# Patient Record
Sex: Male | Born: 1960 | Race: White | Hispanic: No | Marital: Single | Smoking: Former smoker
Health system: Southern US, Community
[De-identification: ages and names within clinical notes are randomized; demographics above are authoritative.]

## PROBLEM LIST (undated history)

## (undated) DIAGNOSIS — K5792 Diverticulitis of intestine, part unspecified, without perforation or abscess without bleeding: Secondary | ICD-10-CM

## (undated) DIAGNOSIS — R17 Unspecified jaundice: Secondary | ICD-10-CM

## (undated) HISTORY — PX: APPENDECTOMY: SHX54

---

## 2018-01-22 ENCOUNTER — Inpatient Hospital Stay (HOSPITAL_COMMUNITY)
Admission: EM | Admit: 2018-01-22 | Discharge: 2018-01-24 | DRG: 392 | Disposition: A | Discharge status: Home / Self Care | Payer: Medicaid - Out of State | Attending: Internal Medicine | Admitting: Internal Medicine

## 2018-01-22 ENCOUNTER — Encounter (HOSPITAL_COMMUNITY): Payer: Self-pay | Admitting: *Deleted

## 2018-01-22 ENCOUNTER — Emergency Department (HOSPITAL_COMMUNITY): Payer: Medicaid - Out of State

## 2018-01-22 ENCOUNTER — Other Ambulatory Visit: Payer: Self-pay

## 2018-01-22 DIAGNOSIS — K5720 Diverticulitis of large intestine with perforation and abscess without bleeding: Principal | ICD-10-CM | POA: Diagnosis present

## 2018-01-22 DIAGNOSIS — K5792 Diverticulitis of intestine, part unspecified, without perforation or abscess without bleeding: Secondary | ICD-10-CM | POA: Diagnosis not present

## 2018-01-22 DIAGNOSIS — Z8249 Family history of ischemic heart disease and other diseases of the circulatory system: Secondary | ICD-10-CM | POA: Diagnosis not present

## 2018-01-22 DIAGNOSIS — Z87891 Personal history of nicotine dependence: Secondary | ICD-10-CM

## 2018-01-22 DIAGNOSIS — R17 Unspecified jaundice: Secondary | ICD-10-CM | POA: Diagnosis not present

## 2018-01-22 DIAGNOSIS — Z825 Family history of asthma and other chronic lower respiratory diseases: Secondary | ICD-10-CM | POA: Diagnosis not present

## 2018-01-22 DIAGNOSIS — E663 Overweight: Secondary | ICD-10-CM | POA: Diagnosis present

## 2018-01-22 DIAGNOSIS — R109 Unspecified abdominal pain: Secondary | ICD-10-CM | POA: Diagnosis present

## 2018-01-22 DIAGNOSIS — Z6826 Body mass index (BMI) 26.0-26.9, adult: Secondary | ICD-10-CM | POA: Diagnosis not present

## 2018-01-22 LAB — LIPASE, BLOOD: LIPASE: 22 U/L (ref 11–51)

## 2018-01-22 LAB — CBC
HEMATOCRIT: 43.9 % (ref 39.0–52.0)
HEMOGLOBIN: 14 g/dL (ref 13.0–17.0)
MCH: 30.2 pg (ref 26.0–34.0)
MCHC: 31.9 g/dL (ref 30.0–36.0)
MCV: 94.8 fL (ref 78.0–100.0)
Platelets: 251 10*3/uL (ref 150–400)
RBC: 4.63 MIL/uL (ref 4.22–5.81)
RDW: 13.3 % (ref 11.5–15.5)
WBC: 12.6 10*3/uL — AB (ref 4.0–10.5)

## 2018-01-22 LAB — COMPREHENSIVE METABOLIC PANEL
ALT: 19 U/L (ref 17–63)
ANION GAP: 13 (ref 5–15)
AST: 17 U/L (ref 15–41)
Albumin: 4.3 g/dL (ref 3.5–5.0)
Alkaline Phosphatase: 66 U/L (ref 38–126)
BILIRUBIN TOTAL: 1.8 mg/dL — AB (ref 0.3–1.2)
BUN: 15 mg/dL (ref 6–20)
CHLORIDE: 101 mmol/L (ref 101–111)
CO2: 25 mmol/L (ref 22–32)
Calcium: 9.8 mg/dL (ref 8.9–10.3)
Creatinine, Ser: 1.07 mg/dL (ref 0.61–1.24)
Glucose, Bld: 96 mg/dL (ref 65–99)
POTASSIUM: 4 mmol/L (ref 3.5–5.1)
Sodium: 139 mmol/L (ref 135–145)
Total Protein: 7.7 g/dL (ref 6.5–8.1)

## 2018-01-22 LAB — URINALYSIS, ROUTINE W REFLEX MICROSCOPIC
Bilirubin Urine: NEGATIVE
GLUCOSE, UA: NEGATIVE mg/dL
Hgb urine dipstick: NEGATIVE
Ketones, ur: 20 mg/dL — AB
LEUKOCYTES UA: NEGATIVE
NITRITE: NEGATIVE
PH: 6 (ref 5.0–8.0)
PROTEIN: NEGATIVE mg/dL
Specific Gravity, Urine: 1.016 (ref 1.005–1.030)

## 2018-01-22 MED ORDER — CIPROFLOXACIN IN D5W 400 MG/200ML IV SOLN
400.0000 mg | Freq: Once | INTRAVENOUS | Status: AC
  Administered 2018-01-22: 400 mg via INTRAVENOUS
  Filled 2018-01-22: qty 200

## 2018-01-22 MED ORDER — KETOROLAC TROMETHAMINE 30 MG/ML IJ SOLN
30.0000 mg | Freq: Once | INTRAMUSCULAR | Status: AC
  Administered 2018-01-22: 30 mg via INTRAVENOUS
  Filled 2018-01-22: qty 1

## 2018-01-22 MED ORDER — ONDANSETRON HCL 4 MG/2ML IJ SOLN
4.0000 mg | Freq: Four times a day (QID) | INTRAMUSCULAR | Status: DC | PRN
  Filled 2018-01-22: qty 2

## 2018-01-22 MED ORDER — IOPAMIDOL (ISOVUE-300) INJECTION 61%
100.0000 mL | Freq: Once | INTRAVENOUS | Status: AC | PRN
  Administered 2018-01-22: 100 mL via INTRAVENOUS

## 2018-01-22 MED ORDER — METRONIDAZOLE IN NACL 5-0.79 MG/ML-% IV SOLN
500.0000 mg | Freq: Once | INTRAVENOUS | Status: AC
  Administered 2018-01-22: 500 mg via INTRAVENOUS
  Filled 2018-01-22: qty 100

## 2018-01-22 MED ORDER — SODIUM CHLORIDE 0.9 % IV BOLUS
500.0000 mL | Freq: Once | INTRAVENOUS | Status: AC
  Administered 2018-01-22: 500 mL via INTRAVENOUS

## 2018-01-22 MED ORDER — HYDROMORPHONE HCL 1 MG/ML IJ SOLN
1.0000 mg | INTRAMUSCULAR | Status: DC | PRN
  Administered 2018-01-23: 1 mg via INTRAVENOUS
  Filled 2018-01-22: qty 1

## 2018-01-22 NOTE — ED Provider Notes (Signed)
[facility] EMERGENCY DEPARTMENT Provider Note   CSN: [id number] Arrival date & time: 01/22/18  1833     History   Chief Complaint Chief Complaint  Patient presents with  . Abdominal Pain    HPI Brandon ServeRandy Zavala is a 57 y.o. male.  HPI Patient presents with abdominal pain.  Began yesterday.  Feels like it may be in the upper abdomen but his whole abdomen hurts.  No nausea or vomiting.  States he has had chills and shakes.  No dysuria.  States he took some Advil and to help with the pain and then the pain localized to the left lower quadrant while he was taking the Advil.  Pain is dull.  Not changed with eating but has had a decreased appetite. History reviewed. No pertinent past medical history.  There are no active problems to display for this patient.   Past Surgical History:  Procedure Laterality Date  . APPENDECTOMY          Home Medications    Prior to Admission medications   Not on File    Family History No family history on file.  Social History Social History   Tobacco Use  . Smoking status: Former Games developermoker  . Smokeless tobacco: Never Used  Substance Use Topics  . Alcohol use: Yes    Frequency: Never  . Drug use: Never     Allergies   Patient has no known allergies.   Review of Systems Review of Systems  Constitutional: Positive for appetite change.  HENT: Negative for congestion.   Respiratory: Negative for shortness of breath.   Gastrointestinal: Positive for abdominal pain. Negative for diarrhea, nausea and vomiting.  Genitourinary: Negative for difficulty urinating.  Musculoskeletal: Negative for back pain.  Neurological: Negative for syncope.  Hematological: Negative for adenopathy.  Psychiatric/Behavioral: Negative for confusion.     Physical Exam Updated Vital Signs BP (!) 142/99   Pulse 96   Temp 99.1 F (37.3 C) (Oral)   Resp 16   Ht 6' (1.829 m)   Wt 88.5 kg (195 lb)   SpO2 97%   BMI 26.45 kg/m   Physical Exam    Constitutional: He appears well-developed.  HENT:  Head: Normocephalic.  Cardiovascular: Normal rate.  Pulmonary/Chest: Breath sounds normal.  Abdominal: Normal appearance.  Moderate diffuse tenderness.  No hernia palpated.  Worse tenderness left lower quadrant.  Neurological: He is alert.  Skin: Skin is warm. Capillary refill takes less than 2 seconds.     ED Treatments / Results  Labs (all labs ordered are listed, but only abnormal results are displayed) Labs Reviewed  COMPREHENSIVE METABOLIC PANEL - Abnormal; Notable for the following components:      Result Value   Total Bilirubin 1.8 (*)    All other components within normal limits  CBC - Abnormal; Notable for the following components:   WBC 12.6 (*)    All other components within normal limits  URINALYSIS, ROUTINE W REFLEX MICROSCOPIC - Abnormal; Notable for the following components:   Ketones, ur 20 (*)    All other components within normal limits  LIPASE, BLOOD    EKG None  Radiology Ct Abdomen Pelvis W Contrast  Result Date: 01/22/2018 CLINICAL DATA:  Mid central abdominal pain starting yesterday. Diverticulitis suspected EXAM: CT ABDOMEN AND PELVIS WITH CONTRAST TECHNIQUE: Multidetector CT imaging of the abdomen and pelvis was performed using the standard protocol following bolus administration of intravenous contrast. CONTRAST:  100mL ISOVUE-300 IOPAMIDOL (ISOVUE-300) INJECTION 61% COMPARISON:  None. FINDINGS:  Lower chest: Normal heart size without pericardial effusion. Clear lung bases. Hepatobiliary: No focal liver abnormality is seen. No gallstones, gallbladder wall thickening, or biliary dilatation. Pancreas: Unremarkable. No pancreatic ductal dilatation or surrounding inflammatory changes. Spleen: Normal in size without focal abnormality. Adrenals/Urinary Tract: Adrenal glands are unremarkable. Kidneys are normal, without renal calculi, focal lesion, or hydronephrosis. Bladder is unremarkable. Stomach/Bowel:  Decompressed stomach with normal small bowel rotation. Mild sympathetic ileus of jejunal loops adjacent to pericolonic inflammation in the left lower quadrant and pelvis secondary to acute diverticulitis. Tiny contained extraluminal gas is seen within the mesenteric fat ventral to the proximal sigmoid. No abscess. 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 No lymphadenopathy. Reproductive: Top-normal size prostate. Unremarkable seminal vesicles. Other: No free fluid. Musculoskeletal: Multilevel degenerative disc disease from L2 through S1. No acute osseous abnormality. No pars defects or listhesis. IMPRESSION: Acute sigmoid diverticulitis with moderate degree of pericolonic inflammation and contained tiny foci of extraluminal gas from presumed perforated diverticulum, series 2/71. No abscess or bowel obstruction. Sympathetic distention and ileus of adjacent jejunal loops from the pericolonic inflammation. These results were called by telephone at the time of interpretation on 01/22/2018 at 9:58 pm to Dr. Benjiman Core , who verbally acknowledged these results. Electronically Signed   By: Tollie Eth M.D.   On: 01/22/2018 21:58    Procedures Procedures (including critical care time)  Medications Ordered in ED Medications  sodium chloride 0.9 % bolus 500 mL (has no administration in time range)  ciprofloxacin (CIPRO) IVPB 400 mg (has no administration in time range)  metroNIDAZOLE (FLAGYL) IVPB 500 mg (has no administration in time range)  iopamidol (ISOVUE-300) 61 % injection 100 mL (100 mLs Intravenous Contrast Given 01/22/18 2141)     Initial Impression / Assessment and Plan / ED Course  I have reviewed the triage vital signs and the nursing notes.  Pertinent labs & imaging results that were available during my care of the patient were reviewed by me and considered in my medical decision making (see chart for details).     Patient with abdominal pain.  White  count mildly elevated.  CT scan shows perforated diverticulitis.  No abscess or however with the perforation will admit to hospital for IV antibiotics.  Will admit to hospitalist.  Need for a drain.  Final Clinical Impressions(s) / ED Diagnoses   Final diagnoses:  Diverticulitis of large intestine with perforation without abscess or bleeding    ED Discharge Orders    None       Benjiman Core, MD 01/22/18 2204

## 2018-01-22 NOTE — ED Notes (Signed)
Patient transported to CT 

## 2018-01-22 NOTE — H&P (Signed)
History and Physical    Brandon ServeRandy Zavala ZOX:[id number] DOB: 11/05/1960 DOA: 01/22/2018  PCP: Patient, No Pcp Per   Patient coming from: Home.  I have personally briefly reviewed patient's [facility] medical [facility]  Chief Complaint: Abdominal pain.  HPI: Brandon ServeRandy Zavala is a 57 y.o. male with no significant previous past medical history who is coming to the emergency department with complaints of abdominal pain that started yesterday associated with anorexia, chills and rigors, but denies fever, nausea, emesis, melena or hematochezia.  Denies dysuria, frequency or hematuria.  Denies sore throat, rhinorrhea, dyspnea, wheezing, hemoptysis, chest pain, palpitations, dizziness, diaphoresis, PND, orthopnea or pitting edema of the lower extremities.  No polyuria, polydipsia or blurred vision.  No heat or cold intolerance.  ED Course: Initial vital signs temperature 99.70F, pulse 105, respirations 16, blood pressure 154/94 mmHg and O2 sat 98% on room air.  The patient received a 500 mL normal saline bolus, ciprofloxacin 400 mg IVPB and Flagyl 500 mg IVPB.  I added Toradol 30 mg, Zofran 4 mg and hydromorphone 1 mg IVP while he was in the ER for symptoms.  His urinalysis was normal except for ketonuria 20 mg/dL.  CBC shows a white count was 12.6, hemoglobin 14.0 g/dL and platelets [id number].  His CMP was normal, except for total bilirubin of 1.8 mg/dL.  No previous results were available to compare to.  Imaging: CT abdomen/pelvis with contrast shows acute sigmoid diverticulitis with moderate degree of pericolonic inflammation and contained tiny foci of extraluminal gas from presumed perforated diverticulum, series 2/71. No abscess or bowel obstruction. Sympathetic distention and ileus of adjacent jejunal loops from the pericolonic inflammation.  Please see images and full radiology report for further detail.  Review of Systems: As per HPI otherwise 10 point review of systems negative.    History  reviewed. No pertinent past medical history.  Past Surgical History:  Procedure Laterality Date  . APPENDECTOMY       reports that he has quit smoking. He has never used smokeless tobacco. He reports that he drinks alcohol. He reports that he does not use drugs.  No Known Allergies  Family History  Problem Relation Age of Onset  . Hypertension Mother   . COPD Sister   . Hypertension Sister     Prior to Admission medications   Medication Sig Start Date End Date Taking? Authorizing Provider  diphenhydrAMINE (BENADRYL) 25 MG tablet Take 25 mg by mouth at bedtime as needed.   Yes [provider]    Physical Exam: Vitals:   01/22/18 1859 01/22/18 2130 01/22/18 2214  BP: (!) 158/94 (!) 142/99 (!) 165/87  Pulse: (!) 105 96 93  Resp: 16  18  Temp: 99.1 F (37.3 C)    TempSrc: Oral    SpO2: 98% 97% 98%  Weight: 88.5 kg (195 lb)    Height: 6' (1.829 m)      Constitutional: NAD, calm, comfortable Eyes: PERRL, lids and conjunctivae normal ENMT: Mucous membranes are moist. Posterior pharynx clear of any exudate or lesions. Neck: normal, supple, no masses, no thyromegaly Respiratory: clear to auscultation bilaterally, no wheezing, no crackles. Normal respiratory effort. No accessory muscle use.  Cardiovascular: Regular rate and rhythm, no murmurs / rubs / gallops. No extremity edema. 2+ pedal pulses. No carotid bruits.  Abdomen: Nondistended.  Soft, positive LLQ tenderness with diffuse radiation, no guarding/rebound/masses palpated. No hepatosplenomegaly. Bowel sounds positive.  Musculoskeletal: no clubbing / cyanosis. Good ROM, no contractures. Normal muscle tone.  Skin: no clinically significant rashes, lesions, ulcers on limited dermatological exam. Neurologic: CN 2-12 grossly intact. Sensation intact, DTR normal. Strength 5/5 in all 4.  Psychiatric: Normal judgment and insight. Alert and oriented x 4. Normal mood.    Labs on Admission: I have personally reviewed  following labs and imaging studies  CBC: Recent Labs  Lab 01/22/18 1911  WBC 12.6*  HGB 14.0  HCT 43.9  MCV 94.8  PLT 251   Basic Metabolic Panel: Recent Labs  Lab 01/22/18 1911  NA 139  K 4.0  CL 101  CO2 25  GLUCOSE 96  BUN 15  CREATININE 1.07  CALCIUM 9.8   GFR: Estimated Creatinine Clearance: 85.6 mL/min (by C-G formula based on SCr of 1.07 mg/dL). Liver Function Tests: Recent Labs  Lab 01/22/18 1911  AST 17  ALT 19  ALKPHOS 66  BILITOT 1.8*  PROT 7.7  ALBUMIN 4.3   Recent Labs  Lab 01/22/18 1911  LIPASE 22   No results for input(s): AMMONIA in the last 168 hours. Coagulation Profile: No results for input(s): INR, PROTIME in the last 168 hours. Cardiac Enzymes: No results for input(s): CKTOTAL, CKMB, CKMBINDEX, TROPONINI in the last 168 hours. BNP (last 3 results) No results for input(s): PROBNP in the last 8760 hours. HbA1C: No results for input(s): HGBA1C in the last 72 hours. CBG: No results for input(s): GLUCAP in the last 168 hours. Lipid Profile: No results for input(s): CHOL, HDL, LDLCALC, TRIG, CHOLHDL, LDLDIRECT in the last 72 hours. Thyroid Function Tests: No results for input(s): TSH, T4TOTAL, FREET4, T3FREE, THYROIDAB in the last 72 hours. Anemia Panel: No results for input(s): VITAMINB12, FOLATE, FERRITIN, TIBC, IRON, RETICCTPCT in the last 72 hours. Urine analysis:    Component Value Date/Time   COLORURINE YELLOW 01/22/2018 1901   APPEARANCEUR CLEAR 01/22/2018 1901   LABSPEC 1.016 01/22/2018 1901   PHURINE 6.0 01/22/2018 1901   GLUCOSEU NEGATIVE 01/22/2018 1901   HGBUR NEGATIVE 01/22/2018 1901   BILIRUBINUR NEGATIVE 01/22/2018 1901   KETONESUR 20 (A) 01/22/2018 1901   PROTEINUR NEGATIVE 01/22/2018 1901   NITRITE NEGATIVE 01/22/2018 1901   LEUKOCYTESUR NEGATIVE 01/22/2018 1901    Radiological Exams on Admission: Ct Abdomen Pelvis W Contrast  Result Date: 01/22/2018 CLINICAL DATA:  Mid central abdominal pain starting  yesterday. Diverticulitis suspected EXAM: CT ABDOMEN AND PELVIS WITH CONTRAST TECHNIQUE: Multidetector CT imaging of the abdomen and pelvis was performed using the standard protocol following bolus administration of intravenous contrast. CONTRAST:  ISOVUE-300 IOPAMIDOL (ISOVUE-300) INJECTION 61% COMPARISON:  None. FINDINGS: Lower chest: Normal heart size without pericardial effusion. Clear lung bases. Hepatobiliary: No focal liver abnormality is seen. No gallstones, gallbladder wall thickening, or biliary dilatation. Pancreas: Unremarkable. No pancreatic ductal dilatation or surrounding inflammatory changes. Spleen: Normal in size without focal abnormality. Adrenals/Urinary Tract: Adrenal glands are unremarkable. Kidneys are normal, without renal calculi, focal lesion, or hydronephrosis. Bladder is unremarkable. Stomach/Bowel: Decompressed stomach with normal small bowel rotation. Mild sympathetic ileus of jejunal loops adjacent to pericolonic inflammation in the left lower quadrant and pelvis secondary to acute diverticulitis. Tiny contained extraluminal gas is seen within the mesenteric fat ventral to the proximal sigmoid. No abscess. 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 No lymphadenopathy. Reproductive: Top-normal size prostate. Unremarkable seminal vesicles. Other: No free fluid. Musculoskeletal: Multilevel degenerative disc disease from L2 through S1. No acute osseous abnormality. No pars defects or listhesis. IMPRESSION: Acute sigmoid diverticulitis with moderate degree of pericolonic inflammation and contained tiny foci of extraluminal gas from  presumed perforated diverticulum, series 2/71. No abscess or bowel obstruction. Sympathetic distention and ileus of adjacent jejunal loops from the pericolonic inflammation. These results were called by telephone at the time of interpretation on 01/22/2018 at 9:58 pm to Dr. Benjiman Core , who verbally acknowledged these  results. Electronically Signed   By: Tollie Eth M.D.   On: 01/22/2018 21:58    EKG: Independently reviewed.  Assessment/Plan Principal problems:   Acute diverticulitis Admit to telemetry/inpatient. Keep n.p.o. Continue IV fluids. Continue ciprofloxacin 400 mg IVPB every 12 hours. Continue Flagyl 500 mg IVPB every 8 hours. Zofran as needed for nausea/emesis. Hydromorphone 1 mg IVP every 4 hours as needed for pain. Protonix 40 mg IVP every 24 hours. Consult general surgery in the morning.  Active problems:   Hyperbilirubinemia Normal transaminases. No findings on CT abdomen to explain this finding. Follow-up CMP daily to trend bilirubin level. Consider right upper quadrant ultrasound.   DVT prophylaxis: SCDs. Code Status: Full code. Family Communication:  Disposition Plan: Admit for IV antibiotics, symptoms management and general surgery evaluation in the morning. Consults called: Routine general surgery counseling a.m. Admission status: Inpatient/telemetry.   Bobette Mo MD [facility] Pager [phone].  If 7PM-7AM, please contact night-coverage [email or web address] Password [facility]  01/22/2018, 10:45 PM   This document was prepared using Dragon voice recognition software and may contain some unintended transcription errors.

## 2018-01-22 NOTE — ED Triage Notes (Signed)
Pt c/o mid center abd pain that started yesterday, denies any n//v/d, pain is worse with palpation of abd area.

## 2018-01-23 ENCOUNTER — Other Ambulatory Visit: Payer: Self-pay

## 2018-01-23 ENCOUNTER — Encounter (HOSPITAL_COMMUNITY): Payer: Self-pay | Admitting: *Deleted

## 2018-01-23 DIAGNOSIS — K5720 Diverticulitis of large intestine with perforation and abscess without bleeding: Principal | ICD-10-CM

## 2018-01-23 DIAGNOSIS — E663 Overweight: Secondary | ICD-10-CM

## 2018-01-23 DIAGNOSIS — R17 Unspecified jaundice: Secondary | ICD-10-CM

## 2018-01-23 DIAGNOSIS — K5792 Diverticulitis of intestine, part unspecified, without perforation or abscess without bleeding: Secondary | ICD-10-CM

## 2018-01-23 LAB — CBC WITH DIFFERENTIAL/PLATELET
BASOS PCT: 0 %
Basophils Absolute: 0 10*3/uL (ref 0.0–0.1)
EOS ABS: 0.1 10*3/uL (ref 0.0–0.7)
Eosinophils Relative: 1 %
HEMATOCRIT: 41 % (ref 39.0–52.0)
Hemoglobin: 12.9 g/dL — ABNORMAL LOW (ref 13.0–17.0)
Lymphocytes Relative: 10 %
Lymphs Abs: 1.1 10*3/uL (ref 0.7–4.0)
MCH: 29.9 pg (ref 26.0–34.0)
MCHC: 31.5 g/dL (ref 30.0–36.0)
MCV: 95.1 fL (ref 78.0–100.0)
MONO ABS: 0.5 10*3/uL (ref 0.1–1.0)
MONOS PCT: 5 %
Neutro Abs: 8.8 10*3/uL — ABNORMAL HIGH (ref 1.7–7.7)
Neutrophils Relative %: 84 %
Platelets: 228 10*3/uL (ref 150–400)
RBC: 4.31 MIL/uL (ref 4.22–5.81)
RDW: 13.5 % (ref 11.5–15.5)
WBC: 10.5 10*3/uL (ref 4.0–10.5)

## 2018-01-23 LAB — BASIC METABOLIC PANEL
Anion gap: 13 (ref 5–15)
BUN: 13 mg/dL (ref 6–20)
CALCIUM: 8.6 mg/dL — AB (ref 8.9–10.3)
CO2: 22 mmol/L (ref 22–32)
CREATININE: 0.98 mg/dL (ref 0.61–1.24)
Chloride: 104 mmol/L (ref 101–111)
GFR calc non Af Amer: >60 mL/min (ref >60)
Glucose, Bld: 86 mg/dL (ref 65–99)
Potassium: 3.4 mmol/L — ABNORMAL LOW (ref 3.5–5.1)
SODIUM: 139 mmol/L (ref 135–145)

## 2018-01-23 MED ORDER — HYDROMORPHONE HCL 1 MG/ML IJ SOLN
1.0000 mg | INTRAMUSCULAR | Status: DC | PRN
  Administered 2018-01-23 (×2): 1 mg via INTRAVENOUS
  Filled 2018-01-23 (×2): qty 1

## 2018-01-23 MED ORDER — DIPHENHYDRAMINE HCL 25 MG PO CAPS
25.0000 mg | ORAL_CAPSULE | Freq: Every evening | ORAL | Status: DC | PRN
  Administered 2018-01-24: 25 mg via ORAL
  Filled 2018-01-23 (×2): qty 1

## 2018-01-23 MED ORDER — ACETAMINOPHEN 650 MG RE SUPP: 650.0000 mg | Freq: Four times a day (QID) | RECTAL | Status: DC | PRN

## 2018-01-23 MED ORDER — SODIUM CHLORIDE 0.9 % IV SOLN
INTRAVENOUS | Status: DC
  Administered 2018-01-23 – 2018-01-24 (×2): via INTRAVENOUS

## 2018-01-23 MED ORDER — ONDANSETRON HCL 4 MG/2ML IJ SOLN
4.0000 mg | Freq: Four times a day (QID) | INTRAMUSCULAR | Status: DC | PRN
  Administered 2018-01-23: 4 mg via INTRAVENOUS

## 2018-01-23 MED ORDER — ONDANSETRON HCL 4 MG PO TABS: 4.0000 mg | ORAL_TABLET | Freq: Four times a day (QID) | ORAL | Status: DC | PRN

## 2018-01-23 MED ORDER — PANTOPRAZOLE SODIUM 40 MG IV SOLR
40.0000 mg | INTRAVENOUS | Status: DC
  Administered 2018-01-23 (×2): 40 mg via INTRAVENOUS
  Filled 2018-01-23 (×2): qty 40

## 2018-01-23 MED ORDER — METRONIDAZOLE IN NACL 5-0.79 MG/ML-% IV SOLN
500.0000 mg | Freq: Three times a day (TID) | INTRAVENOUS | Status: DC
  Administered 2018-01-23 – 2018-01-24 (×5): 500 mg via INTRAVENOUS
  Filled 2018-01-23 (×5): qty 100

## 2018-01-23 MED ORDER — ACETAMINOPHEN 325 MG PO TABS: 650.0000 mg | ORAL_TABLET | Freq: Four times a day (QID) | ORAL | Status: DC | PRN

## 2018-01-23 MED ORDER — CIPROFLOXACIN IN D5W 400 MG/200ML IV SOLN
400.0000 mg | Freq: Two times a day (BID) | INTRAVENOUS | Status: DC
  Administered 2018-01-23 – 2018-01-24 (×3): 400 mg via INTRAVENOUS
  Filled 2018-01-23 (×3): qty 200

## 2018-01-23 MED ORDER — OXYCODONE HCL 5 MG PO TABS
5.0000 mg | ORAL_TABLET | ORAL | Status: DC | PRN
  Administered 2018-01-24 (×2): 5 mg via ORAL
  Filled 2018-01-23 (×2): qty 1

## 2018-01-23 NOTE — Progress Notes (Signed)
PROGRESS NOTE  Brandon Zavala ZOX:[id number] DOB: January 16, 1961 DOA: 01/22/2018 PCP: Patient, No Pcp Per  HPI/Recap of past 22 hours: 57 year old male with no significant past medical history presented to the emergency room on 4/11 with complaints of abdominal pain times 24 hours and workup revealing elevated bilirubin of 1.8 and also acute diverticulitis.  Patient started on IV fluids, IV antibiotics and made n.p.o.  Mildly elevated white blood cell count.  By following day, vital signs stable.  White count normalized.  Seen by general surgery who advance diet to full liquids. Patient himself feeling much better, no complaints .  No abdominal pain tolerating clear liquids  Assessment/Plan: Principal Problem:   Diverticulitis of large intestine with perforation without abscess or bleeding: Mild.  Continue IV antibiotics, full liquids.  Might be able to discharge home tomorrow if tolerating solid food no further episodes of pain.  Patient received education on diverticulitis.  Will need outpatient colonoscopy in a few months once this is stabilized  Elevated bilirubin: At 1.8.  May be in relation to acute illness.  Repeat labs in the morning  Overweight: Patient meets criteria with BMI greater than 25   Code Status: Full code   Family Communication: Left message for family  Disposition Plan: Potential discharge tomorrow if tolerating solids, no pain   Consultants:  General surgery  Procedures:  None  Antimicrobials:  IV Cipro and Flagyl 4/11-present  DVT prophylaxis: SCDs   Objective: Vitals:   01/22/18 2319 01/23/18 0016 01/23/18 0017 01/23/18 0618  BP: (!) 163/102  (!) 152/89 (!) 165/93  Pulse: 95  94 (!) 105  Resp: 18  18 18   Temp:   98.6 F (37 C) 98.6 F (37 C)  TempSrc:   Oral Oral  SpO2: 98%  98% 98%  Weight:  89.5 kg (197 lb 5 oz)    Height:  6' (1.829 m)      Intake/Output Summary (Last 24 hours) at 01/23/2018 1248 Last data filed at 01/23/2018 1017 Gross  per 24 hour  Intake 1886.67 ml  Output 300 ml  Net 1586.67 ml   Filed Weights   01/22/18 1859 01/23/18 0016  Weight: 88.5 kg (195 lb) 89.5 kg (197 lb 5 oz)   Body mass index is 26.76 kg/m.  Exam:   General: Alert and oriented x3, no acute distress  HEENT: Normocephalic and atraumatic mucous membranes are slightly dry  Cardiovascular: Regular rate and rhythm, S1-S2  Respiratory: Clear to auscultation bilaterally  Abdomen: Soft, nontender, nondistended, positive bowel sounds  Musculoskeletal: No clubbing cyanosis or edema  Skin: No skin breaks, tears or lesions  Psychiatry: Appropriate, no evidence of psychoses   Data Reviewed: CBC: Recent Labs  Lab 01/22/18 1911 01/23/18 0622  WBC 12.6* 10.5  NEUTROABS  --  8.8*  HGB 14.0 12.9*  HCT 43.9 41.0  MCV 94.8 95.1  PLT 251 228   Basic Metabolic Panel: Recent Labs  Lab 01/22/18 1911 01/23/18 0622  NA 139 139  K 4.0 3.4*  CL 101 104  CO2 25 22  GLUCOSE 96 86  BUN 15 13  CREATININE 1.07 0.98  CALCIUM 9.8 8.6*   GFR: Estimated Creatinine Clearance: 93.5 mL/min (by C-G formula based on SCr of 0.98 mg/dL). Liver Function Tests: Recent Labs  Lab 01/22/18 1911  AST 17  ALT 19  ALKPHOS 66  BILITOT 1.8*  PROT 7.7  ALBUMIN 4.3   Recent Labs  Lab 01/22/18 1911  LIPASE 22   No results for input(s): AMMONIA  in the last 168 hours. Coagulation Profile: No results for input(s): INR, PROTIME in the last 168 hours. Cardiac Enzymes: No results for input(s): CKTOTAL, CKMB, CKMBINDEX, TROPONINI in the last 168 hours. BNP (last 3 results) No results for input(s): PROBNP in the last 8760 hours. HbA1C: No results for input(s): HGBA1C in the last 72 hours. CBG: No results for input(s): GLUCAP in the last 168 hours. Lipid Profile: No results for input(s): CHOL, HDL, LDLCALC, TRIG, CHOLHDL, LDLDIRECT in the last 72 hours. Thyroid Function Tests: No results for input(s): TSH, T4TOTAL, FREET4, T3FREE, THYROIDAB in  the last 72 hours. Anemia Panel: No results for input(s): VITAMINB12, FOLATE, FERRITIN, TIBC, IRON, RETICCTPCT in the last 72 hours. Urine analysis:    Component Value Date/Time   COLORURINE YELLOW 01/22/2018 1901   APPEARANCEUR CLEAR 01/22/2018 1901   LABSPEC 1.016 01/22/2018 1901   PHURINE 6.0 01/22/2018 1901   GLUCOSEU NEGATIVE 01/22/2018 1901   HGBUR NEGATIVE 01/22/2018 1901   BILIRUBINUR NEGATIVE 01/22/2018 1901   KETONESUR 20 (A) 01/22/2018 1901   PROTEINUR NEGATIVE 01/22/2018 1901   NITRITE NEGATIVE 01/22/2018 1901   LEUKOCYTESUR NEGATIVE 01/22/2018 1901   Sepsis Labs: @LABRCNTIP (procalcitonin:4,lacticidven:4)  )No results found for this or any previous visit (from the past 240 hour(s)).    Studies: Ct Abdomen Pelvis W Contrast  Result Date: 01/22/2018 CLINICAL DATA:  Mid central abdominal pain starting yesterday. Diverticulitis suspected EXAM: CT ABDOMEN AND PELVIS WITH CONTRAST TECHNIQUE: Multidetector CT imaging of the abdomen and pelvis was performed using the standard protocol following bolus administration of intravenous contrast. CONTRAST:  100mL ISOVUE-300 IOPAMIDOL (ISOVUE-300) INJECTION 61% COMPARISON:  None. FINDINGS: Lower chest: Normal heart size without pericardial effusion. Clear lung bases. Hepatobiliary: No focal liver abnormality is seen. No gallstones, gallbladder wall thickening, or biliary dilatation. Pancreas: Unremarkable. No pancreatic ductal dilatation or surrounding inflammatory changes. Spleen: Normal in size without focal abnormality. Adrenals/Urinary Tract: Adrenal glands are unremarkable. Kidneys are normal, without renal calculi, focal lesion, or hydronephrosis. Bladder is unremarkable. Stomach/Bowel: Decompressed stomach with normal small bowel rotation. Mild sympathetic ileus of jejunal loops adjacent to pericolonic inflammation in the left lower quadrant and pelvis secondary to acute diverticulitis. Tiny contained extraluminal gas is seen within the  mesenteric fat ventral to the proximal sigmoid. No abscess. 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 No lymphadenopathy. Reproductive: Top-normal size prostate. Unremarkable seminal vesicles. Other: No free fluid. Musculoskeletal: Multilevel degenerative disc disease from L2 through S1. No acute osseous abnormality. No pars defects or listhesis. IMPRESSION: Acute sigmoid diverticulitis with moderate degree of pericolonic inflammation and contained tiny foci of extraluminal gas from presumed perforated diverticulum, series 2/71. No abscess or bowel obstruction. Sympathetic distention and ileus of adjacent jejunal loops from the pericolonic inflammation. These results were called by telephone at the time of interpretation on 01/22/2018 at 9:58 pm to Dr. Benjiman CoreNATHAN PICKERING , who verbally acknowledged these results. Electronically Signed   By: Tollie Ethavid  Kwon M.D.   On: 01/22/2018 21:58    Scheduled Meds: . pantoprazole (PROTONIX) IV  40 mg Intravenous Q24H    Continuous Infusions: . sodium chloride 100 mL/hr at 01/23/18 0025  . ciprofloxacin Stopped (01/23/18 1017)  . metronidazole Stopped (01/23/18 [id number])     LOS: 1 day     Hollice EspySendil K Ashaad Gaertner, MD [facility]  To reach me or the doctor on call, go to: [email or web address] Password [email or web address]  01/23/2018, 12:48 PM

## 2018-01-23 NOTE — Consult Note (Signed)
[facility] Consult  Reason for Consult: Diverticulitis  Referring Physician:  Dr. Maryland Pink   Chief Complaint    Abdominal Pain      Brandon Zavala is a 57 y.o. male.  HPI: Brandon Zavala is a 57 yo who is otherwise healthy and active who was brought in to the hospital with diverticulitis. Brandon Zavala reports having some LLQ pain and chills on Wednesday, and that the pain continued to progress to even generalized and intense pain requiring a visit to the ED.  Brandon Zavala underwent a CT scan, and this demonstrated acute sigmoid diverticulitis with a small amount of extraluminal air around the colon.  Brandon Zavala otherwise denied any fevers, nausea/vomiting, and has been having some loose stools. Brandon Zavala has never had a colonoscopy, and has not had any unintentional weight loss or blood per rectum.  History reviewed. No pertinent past medical history.  Past Surgical History:  Procedure Laterality Date  . APPENDECTOMY     No colon cancer Family History  Problem Relation Age of Onset  . Hypertension Mother   . COPD Sister   . Hypertension Sister     Social History   Tobacco Use  . Smoking status: Former Research scientist (life sciences)  . Smokeless tobacco: Never Used  Substance Use Topics  . Alcohol use: Yes    Frequency: Never  . Drug use: Never    Medications:  I have reviewed the patient's current medications. Prior to Admission:  Medications Prior to Admission  Medication Sig Dispense Refill Last Dose  . diphenhydrAMINE (BENADRYL) 25 MG tablet Take 25 mg by mouth at bedtime as needed.   Past Month at Unknown time   Scheduled: . pantoprazole (PROTONIX) IV  40 mg Intravenous Q24H   Continuous: . sodium chloride 100 mL/hr at 01/23/18 0025  . ciprofloxacin Stopped (01/23/18 1017)  . metronidazole Stopped (01/23/18 5956)   LOV:FIEPPIRJJOACZ **OR** acetaminophen, diphenhydrAMINE, HYDROmorphone (DILAUDID) injection, ondansetron **OR** ondansetron (ZOFRAN) IV, ondansetron (ZOFRAN) IV, oxyCODONE  Allergies: No Known  Allergies   ROS:  A comprehensive review of systems was negative except for: Gastrointestinal: positive for abdominal pain and loose stool  Blood pressure (!) 165/93, pulse (!) 105, temperature 98.6 F (37 C), temperature source Oral, resp. rate 18, height 6' (1.829 m), weight 197 lb 5 oz (89.5 kg), SpO2 98 %. Physical Exam  Constitutional: Brandon Zavala is oriented to person, place, and time. Brandon Zavala appears well-developed and well-nourished.  HENT:  Head: Normocephalic.  Eyes: Pupils are equal, round, and reactive to light.  Cardiovascular: Normal rate and regular rhythm.  Pulmonary/Chest: Effort normal.  Abdominal: Soft. Normal appearance. There is tenderness in the left lower quadrant.  LLQ tender with deep palpation, no rebound or guarding  Neurological: Brandon Zavala is alert and oriented to person, place, and time.  Skin: Skin is warm and dry.  Psychiatric: Brandon Zavala has a normal mood and affect. His behavior is normal.    Results: Results for orders placed or performed during the hospital encounter of 01/22/18 (from the past 48 hour(s))  Urinalysis, Routine w reflex microscopic     Status: Abnormal   Collection Time: 01/22/18  7:01 PM  Result Value Ref Range   Color, Urine YELLOW YELLOW   APPearance CLEAR CLEAR   Specific Gravity, Urine 1.016 1.005 - 1.030   pH 6.0 5.0 - 8.0   Glucose, UA NEGATIVE NEGATIVE mg/dL   Hgb urine dipstick NEGATIVE NEGATIVE   Bilirubin Urine NEGATIVE NEGATIVE   Ketones, ur 20 (A) NEGATIVE mg/dL   Protein, ur NEGATIVE NEGATIVE mg/dL  Nitrite NEGATIVE NEGATIVE   Leukocytes, UA NEGATIVE NEGATIVE    Comment: Performed at [facility], [address], [place], [place] [address]  Lipase, blood     Status: None   Collection Time: 01/22/18  7:11 PM  Result Value Ref Range   Lipase 22 11 - 51 U/L    Comment: Performed at [facility], [address] [place], [place] [address]  Comprehensive metabolic panel     Status: Abnormal   Collection Time: 01/22/18  7:11 PM  Result Value  Ref Range   Sodium 139 135 - 145 mmol/L   Potassium 4.0 3.5 - 5.1 mmol/L   Chloride 101 101 - 111 mmol/L   CO2 25 22 - 32 mmol/L   Glucose, Bld 96 65 - 99 mg/dL   BUN 15 6 - 20 mg/dL   Creatinine, Ser 1.07 0.61 - 1.24 mg/dL   Calcium 9.8 8.9 - 10.3 mg/dL   Total Protein 7.7 6.5 - 8.1 g/dL   Albumin 4.3 3.5 - 5.0 g/dL   AST 17 15 - 41 U/L   ALT 19 17 - 63 U/L   Alkaline Phosphatase 66 38 - 126 U/L   Total Bilirubin 1.8 (H) 0.3 - 1.2 mg/dL   GFR calc non Af Amer >60 >60 mL/min   GFR calc Af Amer >60 >60 mL/min    Comment: (NOTE) The eGFR has been calculated using the CKD EPI equation. This calculation has not been validated in all clinical situations. eGFR's persistently <60 mL/min signify possible Chronic Kidney Disease.    Anion gap 13 5 - 15    Comment: Performed at [facility], [address], [place], [place] [address]  CBC     Status: Abnormal   Collection Time: 01/22/18  7:11 PM  Result Value Ref Range   WBC 12.6 (H) 4.0 - 10.5 K/uL   RBC 4.63 4.22 - 5.81 MIL/uL   Hemoglobin 14.0 13.0 - 17.0 g/dL   HCT 43.9 39.0 - 52.0 %   MCV 94.8 78.0 - 100.0 fL   MCH 30.2 26.0 - 34.0 pg   MCHC 31.9 30.0 - 36.0 g/dL   RDW 13.3 11.5 - 15.5 %   Platelets 251 150 - 400 K/uL    Comment: Performed at [facility], [address], [place], [place] [address]  CBC WITH DIFFERENTIAL     Status: Abnormal   Collection Time: 01/23/18  6:22 AM  Result Value Ref Range   WBC 10.5 4.0 - 10.5 K/uL   RBC 4.31 4.22 - 5.81 MIL/uL   Hemoglobin 12.9 (L) 13.0 - 17.0 g/dL   HCT 41.0 39.0 - 52.0 %   MCV 95.1 78.0 - 100.0 fL   MCH 29.9 26.0 - 34.0 pg   MCHC 31.5 30.0 - 36.0 g/dL   RDW 13.5 11.5 - 15.5 %   Platelets 228 150 - 400 K/uL   Neutrophils Relative % 84 %   Neutro Abs 8.8 (H) 1.7 - 7.7 K/uL   Lymphocytes Relative 10 %   Lymphs Abs 1.1 0.7 - 4.0 K/uL   Monocytes Relative 5 %   Monocytes Absolute 0.5 0.1 - 1.0 K/uL   Eosinophils Relative 1 %   Eosinophils Absolute 0.1 0.0 - 0.7 K/uL    Basophils Relative 0 %   Basophils Absolute 0.0 0.0 - 0.1 K/uL    Comment: Performed at [facility], [address] [place], [place] [address]  Basic metabolic panel     Status: Abnormal   Collection Time:  01/23/18  6:22 AM  Result Value Ref Range   Sodium 139 135 - 145 mmol/L   Potassium 3.4 (L) 3.5 - 5.1 mmol/L   Chloride 104 101 - 111 mmol/L   CO2 22 22 - 32 mmol/L   Glucose, Bld 86 65 - 99 mg/dL   BUN 13 6 - 20 mg/dL   Creatinine, Ser 0.98 0.61 - 1.24 mg/dL   Calcium 8.6 (L) 8.9 - 10.3 mg/dL   GFR calc non Af Amer >60 >60 mL/min   GFR calc Af Amer >60 >60 mL/min    Comment: (NOTE) The eGFR has been calculated using the CKD EPI equation. This calculation has not been validated in all clinical situations. eGFR's persistently <60 mL/min signify possible Chronic Kidney Disease.    Anion gap 13 5 - 15    Comment: Performed at [facility], [address], [place], [place] [address]   Personally reviewed- colon with diverticula scattered, sigmoid colon  Thickened with hazy mesentery, small foci of air in the mesentery just adjacent to the colon, no fluid collection  Ct Abdomen Pelvis W Contrast  Result Date: 01/22/2018 CLINICAL DATA:  Mid central abdominal pain starting yesterday. Diverticulitis suspected EXAM: CT ABDOMEN AND PELVIS WITH CONTRAST TECHNIQUE: Multidetector CT imaging of the abdomen and pelvis was performed using the standard protocol following bolus administration of intravenous contrast. CONTRAST:  193m ISOVUE-300 IOPAMIDOL (ISOVUE-300) INJECTION 61% COMPARISON:  None. FINDINGS: Lower chest: Normal heart size without pericardial effusion. Clear lung bases. Hepatobiliary: No focal liver abnormality is seen. No gallstones, gallbladder wall thickening, or biliary dilatation. Pancreas: Unremarkable. No pancreatic ductal dilatation or surrounding inflammatory changes. Spleen: Normal in size without focal abnormality. Adrenals/Urinary Tract: Adrenal glands are unremarkable.  Kidneys are normal, without renal calculi, focal lesion, or hydronephrosis. Bladder is unremarkable. Stomach/Bowel: Decompressed stomach with normal small bowel rotation. Mild sympathetic ileus of jejunal loops adjacent to pericolonic inflammation in the left lower quadrant and pelvis secondary to acute diverticulitis. Tiny contained extraluminal gas is seen within the mesenteric fat ventral to the proximal sigmoid. No abscess. 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 No lymphadenopathy. Reproductive: Top-normal size prostate. Unremarkable seminal vesicles. Other: No free fluid. Musculoskeletal: Multilevel degenerative disc disease from L2 through S1. No acute osseous abnormality. No pars defects or listhesis. IMPRESSION: Acute sigmoid diverticulitis with moderate degree of pericolonic inflammation and contained tiny foci of extraluminal gas from presumed perforated diverticulum, series 2/71. No abscess or bowel obstruction. Sympathetic distention and ileus of adjacent jejunal loops from the pericolonic inflammation. These results were called by telephone at the time of interpretation on 01/22/2018 at 9:58 pm to Dr. NDavonna Belling, who verbally acknowledged these results. Electronically Signed   By: DAshley RoyaltyM.D.   On: 01/22/2018 21:58     Assessment & Plan:  RCurties Conigliarois a 57y.o. male with acute diverticulitis with a small foci of air, which technically makes this complicated diverticulitis but at most [place] I.  Brandon Zavala is doing well overall and his pain is much improved since admission. His labs have also improved.   -PRN for pain, added roxicodone PRN -Full liquid diet and adv as tolerated -IV antibiotics for now -Will see how pain is tomorrow and labs, may be able to go home as early as Saturday /Sunday if doing well with diet and tolerating orals  -Will need colonoscopy in the next 8 weeks -Will follow up to discuss options for elective sigmoid colectomy given  the small perforation   All questions were  answered to the satisfaction of the patient.    Virl Cagey 01/23/2018, 10:27 AM      ]

## 2018-01-24 LAB — COMPREHENSIVE METABOLIC PANEL
ALBUMIN: 3.2 g/dL — AB (ref 3.5–5.0)
ALT: 14 U/L — ABNORMAL LOW (ref 17–63)
ANION GAP: 10 (ref 5–15)
AST: 13 U/L — ABNORMAL LOW (ref 15–41)
Alkaline Phosphatase: 56 U/L (ref 38–126)
BUN: 9 mg/dL (ref 6–20)
CHLORIDE: 103 mmol/L (ref 101–111)
CO2: 25 mmol/L (ref 22–32)
Calcium: 8.5 mg/dL — ABNORMAL LOW (ref 8.9–10.3)
Creatinine, Ser: 0.86 mg/dL (ref 0.61–1.24)
GFR calc non Af Amer: >60 mL/min (ref >60)
GLUCOSE: 100 mg/dL — AB (ref 65–99)
POTASSIUM: 3.5 mmol/L (ref 3.5–5.1)
SODIUM: 138 mmol/L (ref 135–145)
Total Bilirubin: 1.2 mg/dL (ref 0.3–1.2)
Total Protein: 6.4 g/dL — ABNORMAL LOW (ref 6.5–8.1)

## 2018-01-24 LAB — CBC WITH DIFFERENTIAL/PLATELET
BASOS PCT: 0 %
Basophils Absolute: 0 10*3/uL (ref 0.0–0.1)
EOS ABS: 0.1 10*3/uL (ref 0.0–0.7)
EOS PCT: 2 %
HCT: 39.2 % (ref 39.0–52.0)
Hemoglobin: 12 g/dL — ABNORMAL LOW (ref 13.0–17.0)
Lymphocytes Relative: 16 %
Lymphs Abs: 1.4 10*3/uL (ref 0.7–4.0)
MCH: 29.6 pg (ref 26.0–34.0)
MCHC: 30.6 g/dL (ref 30.0–36.0)
MCV: 96.6 fL (ref 78.0–100.0)
MONO ABS: 0.6 10*3/uL (ref 0.1–1.0)
MONOS PCT: 7 %
NEUTROS PCT: 75 %
Neutro Abs: 6.9 10*3/uL (ref 1.7–7.7)
PLATELETS: 246 10*3/uL (ref 150–400)
RBC: 4.06 MIL/uL — ABNORMAL LOW (ref 4.22–5.81)
RDW: 13.5 % (ref 11.5–15.5)
WBC: 9.1 10*3/uL (ref 4.0–10.5)

## 2018-01-24 LAB — HIV ANTIBODY (ROUTINE TESTING W REFLEX): HIV Screen 4th Generation wRfx: NONREACTIVE

## 2018-01-24 MED ORDER — METRONIDAZOLE 500 MG PO TABS: 500.0000 mg | ORAL_TABLET | Freq: Three times a day (TID) | ORAL | 0 refills | Status: DC

## 2018-01-24 MED ORDER — LEVOFLOXACIN 750 MG PO TABS: 750.0000 mg | ORAL_TABLET | Freq: Every day | ORAL | 0 refills | Status: DC

## 2018-01-24 MED ORDER — ACETAMINOPHEN 325 MG PO TABS: 650.0000 mg | ORAL_TABLET | Freq: Four times a day (QID) | ORAL | Status: DC | PRN

## 2018-01-24 MED ORDER — METRONIDAZOLE 500 MG PO TABS: 500.0000 mg | ORAL_TABLET | Freq: Three times a day (TID) | ORAL | 0 refills | Status: AC

## 2018-01-24 MED ORDER — LEVOFLOXACIN 750 MG PO TABS: 750.0000 mg | ORAL_TABLET | Freq: Every day | ORAL | 0 refills | Status: AC

## 2018-01-24 MED ORDER — OXYCODONE HCL 5 MG PO TABS: 5.0000 mg | ORAL_TABLET | ORAL | 0 refills | Status: AC | PRN

## 2018-01-24 NOTE — Discharge Summary (Signed)
Physician Discharge Summary  Brandon Zavala ZOX:[id number] DOB: 01/05/1961 DOA: 01/22/2018  PCP: Patient, No Pcp Per  Admit date: 01/22/2018 Discharge date: 01/24/2018  Time spent: >35 minutes  Recommendations for Outpatient Follow-up:  F/u with surgery for colonoscopy, f/u in 2 weeks to schedule  F/u with PCP in 3-7 days  Discharge Diagnoses:  Principal Problem:   Diverticulitis of large intestine with perforation without abscess or bleeding   Discharge Condition: stable   Diet recommendation: regular   Filed Weights   01/22/18 1859 01/23/18 0016  Weight: 88.5 kg (195 lb) 89.5 kg (197 lb 5 oz)    History of present illness:   57 year old male with no significant past medical history presented to the emergency room on 4/11 with complaints of abdominal pain times 24 hours and workup revealing elevated bilirubin of 1.8 and also acute diverticulitis. Patient started on IV fluids, IV antibiotics and made n.p.o. Mildly elevated white blood cell count.   Hospital Course:    Diverticulitis of large intestine with perforation without abscess or bleeding:Mild. improved with IV antibiotics, diet tolerated well. Then transitioned to oral antibiotic regimen to comopletye the treatment course.  Patient received education on diverticulitis. outpatient colonoscopy in 8 weeks. Short course oxycodone prn for pain   Elevated bilirubin: At 1.8. May be in relation to acute illness. Repeat labs in the morning 1.2  Overweight: Patient meets criteria with BMI greater than 25   Procedures:  none (i.e. Studies not automatically included, echos, thoracentesis, etc; not x-rays)  Consultations:  Surgery   Discharge Exam: Vitals:   01/23/18 2121 01/24/18 0554  BP: (!) 159/94 (!) 165/98  Pulse: 89 (!) 104  Resp: 18 18  Temp: 98.4 F (36.9 C) 98.4 F (36.9 C)  SpO2: 98% 97%    General: no distress  Cardiovascular: s1 s2 rrr Respiratory: CTA BL  Discharge Instructions  Discharge  Instructions    Diet - low sodium heart healthy   Complete by:  As directed    Increase activity slowly   Complete by:  As directed      Allergies as of 01/24/2018   No Known Allergies     Medication List    STOP taking these medications   diphenhydrAMINE 25 MG tablet Commonly known as:  BENADRYL     TAKE these medications   acetaminophen 325 MG tablet Commonly known as:  TYLENOL Take 2 tablets (650 mg total) by mouth every 6 (six) hours as needed for mild pain (or Fever >/= 101).   levofloxacin 750 MG tablet Commonly known as:  LEVAQUIN Take 1 tablet (750 mg total) by mouth daily for 12 days.   metroNIDAZOLE 500 MG tablet Commonly known as:  FLAGYL Take 1 tablet (500 mg total) by mouth 3 (three) times daily for 12 days.   oxyCODONE 5 MG immediate release tablet Commonly known as:  Oxy IR/ROXICODONE Take 1 tablet (5 mg total) by mouth every 4 (four) hours as needed for up to 3 days for moderate pain.      No Known Allergies Follow-up Information    Lucretia RoersBridges, Lindsay C, MD Follow up in 2 week(s).   Specialty:  General Surgery Contact information: [address] [place] [facility] [place] [id number] [phone]            The results of significant diagnostics from this hospitalization (including imaging, microbiology, ancillary and laboratory) are listed below for reference.    Significant Diagnostic Studies: Ct Abdomen Pelvis W Contrast  Result Date: 01/22/2018 CLINICAL DATA:  Mid  central abdominal pain starting yesterday. Diverticulitis suspected EXAM: CT ABDOMEN AND PELVIS WITH CONTRAST TECHNIQUE: Multidetector CT imaging of the abdomen and pelvis was performed using the standard protocol following bolus administration of intravenous contrast. CONTRAST:  ISOVUE-300 IOPAMIDOL (ISOVUE-300) INJECTION 61% COMPARISON:  None. FINDINGS: Lower chest: Normal heart size without pericardial effusion. Clear lung bases. Hepatobiliary: No focal liver abnormality is seen. No  gallstones, gallbladder wall thickening, or biliary dilatation. Pancreas: Unremarkable. No pancreatic ductal dilatation or surrounding inflammatory changes. Spleen: Normal in size without focal abnormality. Adrenals/Urinary Tract: Adrenal glands are unremarkable. Kidneys are normal, without renal calculi, focal lesion, or hydronephrosis. Bladder is unremarkable. Stomach/Bowel: Decompressed stomach with normal small bowel rotation. Mild sympathetic ileus of jejunal loops adjacent to pericolonic inflammation in the left lower quadrant and pelvis secondary to acute diverticulitis. Tiny contained extraluminal gas is seen within the mesenteric fat ventral to the proximal sigmoid. No abscess. 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 No lymphadenopathy. Reproductive: Top-normal size prostate. Unremarkable seminal vesicles. Other: No free fluid. Musculoskeletal: Multilevel degenerative disc disease from L2 through S1. No acute osseous abnormality. No pars defects or listhesis. IMPRESSION: Acute sigmoid diverticulitis with moderate degree of pericolonic inflammation and contained tiny foci of extraluminal gas from presumed perforated diverticulum, series 2/71. No abscess or bowel obstruction. Sympathetic distention and ileus of adjacent jejunal loops from the pericolonic inflammation. These results were called by telephone at the time of interpretation on 01/22/2018 at 9:58 pm to Dr. Benjiman Core , who verbally acknowledged these results. Electronically Signed   By: Tollie Eth M.D.   On: 01/22/2018 21:58    Microbiology: No results found for this or any previous visit (from the past 240 hour(s)).   Labs: Basic Metabolic Panel: Recent Labs  Lab 01/22/18 1911 01/23/18 0622 01/24/18 0622  NA 139 139 138  K 4.0 3.4* 3.5  CL 101 104 103  CO2 25 22 25   GLUCOSE 96 86 100*  BUN 15 13 9   CREATININE 1.07 0.98 0.86  CALCIUM 9.8 8.6* 8.5*   Liver Function Tests: Recent Labs   Lab 01/22/18 1911 01/24/18 0622  AST 17 13*  ALT 19 14*  ALKPHOS 66 56  BILITOT 1.8* 1.2  PROT 7.7 6.4*  ALBUMIN 4.3 3.2*   Recent Labs  Lab 01/22/18 1911  LIPASE 22   No results for input(s): AMMONIA in the last 168 hours. CBC: Recent Labs  Lab 01/22/18 1911 01/23/18 0622 01/24/18 0622  WBC 12.6* 10.5 9.1  NEUTROABS  --  8.8* 6.9  HGB 14.0 12.9* 12.0*  HCT 43.9 41.0 39.2  MCV 94.8 95.1 96.6  PLT 251 228 246   Cardiac Enzymes: No results for input(s): CKTOTAL, CKMB, CKMBINDEX, TROPONINI in the last 168 hours. BNP: BNP (last 3 results) No results for input(s): BNP in the last 8760 hours.  ProBNP (last 3 results) No results for input(s): PROBNP in the last 8760 hours.  CBG: No results for input(s): GLUCAP in the last 168 hours.     SignedEsperanza Sheets  [facility] 01/24/2018, 1:29 PM

## 2018-01-24 NOTE — Discharge Instructions (Signed)

## 2018-01-24 NOTE — Progress Notes (Signed)
[facility] Progress Note     Subjective: Doing well. Ambulating. Tolerating liquids. Did have some nausea with pain meds and vomited, but has tolerated diet since that time. Pain improved.   Objective: Vital signs in last 24 hours: Temp:  [98.1 F (36.7 C)-98.4 F (36.9 C)] 98.4 F (36.9 C) (04/13 0554) Pulse Rate:  [88-104] 104 (04/13 0554) Resp:  [16-18] 18 (04/13 0554) BP: (154-165)/(94-104) 165/98 (04/13 0554) SpO2:  [73 %-98 %] 97 % (04/13 0554) Last BM Date: 01/21/18  Intake/Output from previous day: 04/12 0701 - 04/13 0700 In: 3980 [P.O.:720; I.V.:2460; IV Piggyback:800] Out: 400 [Urine:400] Intake/Output this shift: Total I/O In: 200 [IV Piggyback:200] Out: -   General appearance: alert, cooperative and no distress Resp: normal work breathing GI: soft, nondistended, tender with deep palpation to the LLQ Extremities: extremities normal, atraumatic, no cyanosis or edema  Lab Results:  Recent Labs    01/23/18 0622 01/24/18 0622  WBC 10.5 9.1  HGB 12.9* 12.0*  HCT 41.0 39.2  PLT 228 246   BMET Recent Labs    01/23/18 0622 01/24/18 0622  NA 139 138  K 3.4* 3.5  CL 104 103  CO2 22 25  GLUCOSE 86 100*  BUN 13 9  CREATININE 0.98 0.86  CALCIUM 8.6* 8.5*     Anti-infectives: Anti-infectives (From admission, onward)   Start     Dose/Rate Route Frequency Ordered Stop   01/23/18 1000  ciprofloxacin (CIPRO) IVPB 400 mg     400 mg 200 mL/hr over 60 Minutes Intravenous Every 12 hours 01/23/18 0004     01/23/18 0700  metroNIDAZOLE (FLAGYL) IVPB 500 mg     500 mg 100 mL/hr over 60 Minutes Intravenous Every 8 hours 01/23/18 0004     01/22/18 2200  ciprofloxacin (CIPRO) IVPB 400 mg     400 mg 200 mL/hr over 60 Minutes Intravenous  Once 01/22/18 2159 01/22/18 2318   01/22/18 2200  metroNIDAZOLE (FLAGYL) IVPB 500 mg     500 mg 100 mL/hr over 60 Minutes Intravenous  Once 01/22/18 2159 01/23/18 0015      Assessment/Plan: Mr. Ottis StainHuff is a  57 yo with acute diverticulitis with small contained microperforation. He is doing well. Tolerating diet and pain improved. -If tolerates soft diet for lunch can d/c home -Send home with oral pain meds, roxicodone is what has been helping in the hospital  -Complete a 14 day course total of antibiotics for diverticulitis -Will need outpatient colonoscopy in the next 4-6 weeks -Will see in clinic in about 2 weeks    LOS: 2 days    Lucretia RoersLindsay C Bridges 01/24/2018

## 2018-01-24 NOTE — Progress Notes (Signed)
[facility] PROGRESS NOTE  Blanchie ServeRandy Chipman IEP:[id number] DOB: 08/27/1961 DOA: 01/22/2018 PCP: Patient, No Pcp Per  Brief summary   57 year old male with no significant past medical history presented to the emergency room on 4/11 with complaints of abdominal pain times 24 hours and workup revealing elevated bilirubin of 1.8 and also acute diverticulitis.  Patient started on IV fluids, IV antibiotics and made n.p.o.  Mildly elevated white blood cell count.  Assessment/Plan:  Principal Problem:  Diverticulitis of large intestine with perforation without abscess or bleeding: Mild. improving with IV antibiotics, full liquids. Diet to advance if tolerated today.  Patient received education on diverticulitis.  Will need outpatient colonoscopy in a few months once this is stabilized. Appreciate surgery input   Elevated bilirubin: At 1.8.  May be in relation to acute illness.  Repeat labs in the morning 1.2  Overweight: Patient meets criteria with BMI greater than 25  Elevated BP. Possible due to pain. No h/o HTN. Will start oral medication regimen if persistent   Code Status: full Family Communication: d/w patient (indicate person spoken with, relationship, and if by phone, the number) Disposition Plan: home 24-48 hrs    Consultants:  Surgery   Procedures:  none  Antibiotics: Anti-infectives (From admission, onward)   Start     Dose/Rate Route Frequency Ordered Stop   01/23/18 1000  ciprofloxacin (CIPRO) IVPB 400 mg     400 mg 200 mL/hr over 60 Minutes Intravenous Every 12 hours 01/23/18 0004     01/23/18 0700  metroNIDAZOLE (FLAGYL) IVPB 500 mg     500 mg 100 mL/hr over 60 Minutes Intravenous Every 8 hours 01/23/18 0004     01/22/18 2200  ciprofloxacin (CIPRO) IVPB 400 mg     400 mg 200 mL/hr over 60 Minutes Intravenous  Once 01/22/18 2159 01/22/18 2318   01/22/18 2200  metroNIDAZOLE (FLAGYL) IVPB 500 mg     500 mg 100 mL/hr over 60 Minutes Intravenous  Once 01/22/18 2159  01/23/18 0015        (indicate start date, and stop date if known)  HPI/Subjective: Alert. Reports feeling better. Vomited yesterday lunch, no recurrence   Objective: Vitals:   01/23/18 2121 01/24/18 0554  BP: (!) 159/94 (!) 165/98  Pulse: 89 (!) 104  Resp: 18 18  Temp: 98.4 F (36.9 C) 98.4 F (36.9 C)  SpO2: 98% 97%    Intake/Output Summary (Last 24 hours) at 01/24/2018 0914 Last data filed at 01/24/2018 0700 Gross per 24 hour  Intake 3980 ml  Output 400 ml  Net 3580 ml   Filed Weights   01/22/18 1859 01/23/18 0016  Weight: 88.5 kg (195 lb) 89.5 kg (197 lb 5 oz)    Exam:   General:  No distress   Cardiovascular: s1,s2 rrr  Respiratory: CTA BL  Abdomen: soft, mild tedner  Musculoskeletal: no leg edema    Data Reviewed: Basic Metabolic Panel: Recent Labs  Lab 01/22/18 1911 01/23/18 0622 01/24/18 0622  NA 139 139 138  K 4.0 3.4* 3.5  CL 101 104 103  CO2 25 22 25   GLUCOSE 96 86 100*  BUN 15 13 9   CREATININE 1.07 0.98 0.86  CALCIUM 9.8 8.6* 8.5*   Liver Function Tests: Recent Labs  Lab 01/22/18 1911 01/24/18 0622  AST 17 13*  ALT 19 14*  ALKPHOS 66 56  BILITOT 1.8* 1.2  PROT 7.7 6.4*  ALBUMIN 4.3 3.2*   Recent Labs  Lab 01/22/18 1911  LIPASE 22   No  results for input(s): AMMONIA in the last 168 hours. CBC: Recent Labs  Lab 01/22/18 1911 01/23/18 0622 01/24/18 0622  WBC 12.6* 10.5 9.1  NEUTROABS  --  8.8* 6.9  HGB 14.0 12.9* 12.0*  HCT 43.9 41.0 39.2  MCV 94.8 95.1 96.6  PLT 251 228 246   Cardiac Enzymes: No results for input(s): CKTOTAL, CKMB, CKMBINDEX, TROPONINI in the last 168 hours. BNP (last 3 results) No results for input(s): BNP in the last 8760 hours.  ProBNP (last 3 results) No results for input(s): PROBNP in the last 8760 hours.  CBG: No results for input(s): GLUCAP in the last 168 hours.  No results found for this or any previous visit (from the past 240 hour(s)).   Studies: Ct Abdomen Pelvis W  Contrast  Result Date: 01/22/2018 CLINICAL DATA:  Mid central abdominal pain starting yesterday. Diverticulitis suspected EXAM: CT ABDOMEN AND PELVIS WITH CONTRAST TECHNIQUE: Multidetector CT imaging of the abdomen and pelvis was performed using the standard protocol following bolus administration of intravenous contrast. CONTRAST:  ISOVUE-300 IOPAMIDOL (ISOVUE-300) INJECTION 61% COMPARISON:  None. FINDINGS: Lower chest: Normal heart size without pericardial effusion. Clear lung bases. Hepatobiliary: No focal liver abnormality is seen. No gallstones, gallbladder wall thickening, or biliary dilatation. Pancreas: Unremarkable. No pancreatic ductal dilatation or surrounding inflammatory changes. Spleen: Normal in size without focal abnormality. Adrenals/Urinary Tract: Adrenal glands are unremarkable. Kidneys are normal, without renal calculi, focal lesion, or hydronephrosis. Bladder is unremarkable. Stomach/Bowel: Decompressed stomach with normal small bowel rotation. Mild sympathetic ileus of jejunal loops adjacent to pericolonic inflammation in the left lower quadrant and pelvis secondary to acute diverticulitis. Tiny contained extraluminal gas is seen within the mesenteric fat ventral to the proximal sigmoid. No abscess. 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 No lymphadenopathy. Reproductive: Top-normal size prostate. Unremarkable seminal vesicles. Other: No free fluid. Musculoskeletal: Multilevel degenerative disc disease from L2 through S1. No acute osseous abnormality. No pars defects or listhesis. IMPRESSION: Acute sigmoid diverticulitis with moderate degree of pericolonic inflammation and contained tiny foci of extraluminal gas from presumed perforated diverticulum, series 2/71. No abscess or bowel obstruction. Sympathetic distention and ileus of adjacent jejunal loops from the pericolonic inflammation. These results were called by telephone at the time of  interpretation on 01/22/2018 at 9:58 pm to Dr. Benjiman Core , who verbally acknowledged these results. Electronically Signed   By: Tollie Eth M.D.   On: 01/22/2018 21:58    Scheduled Meds: . pantoprazole (PROTONIX) IV  40 mg Intravenous Q24H   Continuous Infusions: . sodium chloride Stopped (01/24/18 0625)  . ciprofloxacin 400 mg (01/24/18 0823)  . metronidazole Stopped (01/24/18 0725)    Principal Problem:   Diverticulitis of large intestine with perforation without abscess or bleeding    Time spent: >35 minutes     Esperanza Sheets  [facility] Pager [phone]. If 7PM-7AM, please contact night-coverage at [email or web address], password [facility] 01/24/2018, 9:14 AM  LOS: 2 days

## 2018-02-05 ENCOUNTER — Encounter: Payer: Self-pay | Admitting: General Surgery

## 2018-02-05 ENCOUNTER — Ambulatory Visit (INDEPENDENT_AMBULATORY_CARE_PROVIDER_SITE_OTHER): Payer: Medicaid - Out of State | Admitting: General Surgery

## 2018-02-05 VITALS — BP 175/106 | HR 104 | Temp 99.6°F | Resp 18 | Ht 72.0 in | Wt 193.0 lb

## 2018-02-05 DIAGNOSIS — I10 Essential (primary) hypertension: Secondary | ICD-10-CM | POA: Diagnosis not present

## 2018-02-05 DIAGNOSIS — K5720 Diverticulitis of large intestine with perforation and abscess without bleeding: Secondary | ICD-10-CM | POA: Diagnosis not present

## 2018-02-06 NOTE — Progress Notes (Signed)
[facility] Note   HPI:  57 y.o. Male presents to clinic for follow-up evaluation of diverticulitis with a microperforation.  He was in the hospital for a few days on antibiotics, and did well. He has had some soreness, but overall has been feeling well. He has been trying to eat food with fiber and drink plenty of water.  He reports minimal use of pain medication.   Review of Systems:  No fevers or chills Minor soreness  All other review of systems: otherwise negative   Vital Signs:  BP (!) 175/106   Pulse (!) 104   Temp 99.6 F (37.6 C)   Resp 18   Ht 6' (1.829 m)   Wt 193 lb (87.5 kg)   BMI 26.18 kg/m    Physical Exam:  Physical Exam  Constitutional: He is oriented to person, place, and time. He appears well-developed and well-nourished.  HENT:  Head: Normocephalic.  Eyes: Pupils are equal, round, and reactive to light.  Neck: Normal range of motion.  Cardiovascular: Normal rate and regular rhythm.  Pulmonary/Chest: Effort normal.  Abdominal: Soft. He exhibits no distension.  Minimally tender LLQ, to deep palpation  Musculoskeletal: Normal range of motion. He exhibits no edema.  Neurological: He is alert and oriented to person, place, and time.  Vitals reviewed.   Laboratory studies: None    Imaging:  CT from 01/22/18 IMPRESSION: Acute sigmoid diverticulitis with moderate degree of pericolonic inflammation and contained tiny foci of extraluminal gas from presumed perforated diverticulum, series 2/71. No abscess or bowel obstruction. Sympathetic distention and ileus of adjacent jejunal loops from the pericolonic inflammation.  Assessment:  57 y.o. yo Male with a recent first episode of diverticulitis with a small microperforation with a foci of air and no fluid.  He is doing much better.  We have discussed that diverticulitis is a spectrum of disease and that his case technically qualifies as a complicated diverticulitis due to the question of the  small perforation. Yet, given that he did well and only needed antibiotics, I do not feel stronger regarding about him getting an elective surgery at this venture.  He overall is doing well.  He has not had a colonoscopy and will need this in the upcoming weeks.   Plan:  - Refer to Dr. Karilyn Cotaehman for a colonoscopy for screening / diverticulitis   - Refer to PCP, told him about Dr. Tracie HarrierHagler, will see if she is accepting more patients. His BP was running higher in the hospital and higher in clinic today.   - Will follow up with me PRN after colonoscopy if decided wants to pursue elective sigmoid colectomy  - Discussed the main risk and benefits of surgery, the downtime/ healing time, etc    All of the above recommendations were discussed with the patient, and all of patient's questions were answered to his expressed satisfaction.  Algis GreenhouseLindsay Bridges, MD [facility] [address] [place], [place] [id number] [phone] (office)

## 2018-02-12 ENCOUNTER — Emergency Department (HOSPITAL_COMMUNITY): Payer: Medicaid - Out of State

## 2018-02-12 ENCOUNTER — Inpatient Hospital Stay (HOSPITAL_COMMUNITY)
Admission: EM | Admit: 2018-02-12 | Discharge: 2018-02-17 | DRG: 392 | Disposition: A | Discharge status: Home / Self Care | Payer: Medicaid - Out of State | Attending: Family Medicine | Admitting: Family Medicine

## 2018-02-12 ENCOUNTER — Encounter (HOSPITAL_COMMUNITY): Payer: Self-pay | Admitting: Emergency Medicine

## 2018-02-12 ENCOUNTER — Other Ambulatory Visit: Payer: Self-pay

## 2018-02-12 DIAGNOSIS — Z9049 Acquired absence of other specified parts of digestive tract: Secondary | ICD-10-CM | POA: Diagnosis not present

## 2018-02-12 DIAGNOSIS — F411 Generalized anxiety disorder: Secondary | ICD-10-CM | POA: Diagnosis present

## 2018-02-12 DIAGNOSIS — R1032 Left lower quadrant pain: Secondary | ICD-10-CM | POA: Diagnosis not present

## 2018-02-12 DIAGNOSIS — F419 Anxiety disorder, unspecified: Secondary | ICD-10-CM | POA: Diagnosis not present

## 2018-02-12 DIAGNOSIS — Z87891 Personal history of nicotine dependence: Secondary | ICD-10-CM | POA: Diagnosis not present

## 2018-02-12 DIAGNOSIS — Z825 Family history of asthma and other chronic lower respiratory diseases: Secondary | ICD-10-CM

## 2018-02-12 DIAGNOSIS — R109 Unspecified abdominal pain: Secondary | ICD-10-CM

## 2018-02-12 DIAGNOSIS — R17 Unspecified jaundice: Secondary | ICD-10-CM

## 2018-02-12 DIAGNOSIS — K5780 Diverticulitis of intestine, part unspecified, with perforation and abscess without bleeding: Secondary | ICD-10-CM | POA: Diagnosis present

## 2018-02-12 DIAGNOSIS — Z8249 Family history of ischemic heart disease and other diseases of the circulatory system: Secondary | ICD-10-CM | POA: Diagnosis not present

## 2018-02-12 DIAGNOSIS — K5720 Diverticulitis of large intestine with perforation and abscess without bleeding: Principal | ICD-10-CM | POA: Diagnosis present

## 2018-02-12 HISTORY — DX: Diverticulitis of intestine, part unspecified, without perforation or abscess without bleeding: K57.92

## 2018-02-12 HISTORY — DX: Unspecified jaundice: R17

## 2018-02-12 LAB — CBC WITH DIFFERENTIAL/PLATELET
BASOS ABS: 0 10*3/uL (ref 0.0–0.1)
BASOS PCT: 0 %
EOS ABS: 0 10*3/uL (ref 0.0–0.7)
Eosinophils Relative: 0 %
HCT: 43.1 % (ref 39.0–52.0)
Hemoglobin: 14 g/dL (ref 13.0–17.0)
LYMPHS ABS: 1.2 10*3/uL (ref 0.7–4.0)
Lymphocytes Relative: 9 %
MCH: 30.2 pg (ref 26.0–34.0)
MCHC: 32.5 g/dL (ref 30.0–36.0)
MCV: 93.1 fL (ref 78.0–100.0)
Monocytes Absolute: 1.1 10*3/uL — ABNORMAL HIGH (ref 0.1–1.0)
Monocytes Relative: 8 %
NEUTROS PCT: 83 %
Neutro Abs: 11.3 10*3/uL — ABNORMAL HIGH (ref 1.7–7.7)
Platelets: 358 10*3/uL (ref 150–400)
RBC: 4.63 MIL/uL (ref 4.22–5.81)
RDW: 13 % (ref 11.5–15.5)
WBC: 13.6 10*3/uL — AB (ref 4.0–10.5)

## 2018-02-12 LAB — HEPATIC FUNCTION PANEL
ALBUMIN: 4.1 g/dL (ref 3.5–5.0)
ALK PHOS: 73 U/L (ref 38–126)
ALT: 15 U/L — ABNORMAL LOW (ref 17–63)
AST: 17 U/L (ref 15–41)
BILIRUBIN INDIRECT: 1.2 mg/dL — AB (ref 0.3–0.9)
Bilirubin, Direct: 0.2 mg/dL (ref 0.1–0.5)
TOTAL PROTEIN: 8.1 g/dL (ref 6.5–8.1)
Total Bilirubin: 1.4 mg/dL — ABNORMAL HIGH (ref 0.3–1.2)

## 2018-02-12 LAB — URINALYSIS, ROUTINE W REFLEX MICROSCOPIC
BILIRUBIN URINE: NEGATIVE
Bacteria, UA: NONE SEEN
Glucose, UA: NEGATIVE mg/dL
Ketones, ur: NEGATIVE mg/dL
LEUKOCYTES UA: NEGATIVE
NITRITE: NEGATIVE
PROTEIN: NEGATIVE mg/dL
Specific Gravity, Urine: 1.004 — ABNORMAL LOW (ref 1.005–1.030)
pH: 7 (ref 5.0–8.0)

## 2018-02-12 LAB — BASIC METABOLIC PANEL
Anion gap: 14 (ref 5–15)
BUN: 10 mg/dL (ref 6–20)
CALCIUM: 9.5 mg/dL (ref 8.9–10.3)
CHLORIDE: 98 mmol/L — AB (ref 101–111)
CO2: 24 mmol/L (ref 22–32)
CREATININE: 0.91 mg/dL (ref 0.61–1.24)
Glucose, Bld: 102 mg/dL — ABNORMAL HIGH (ref 65–99)
Potassium: 4 mmol/L (ref 3.5–5.1)
SODIUM: 136 mmol/L (ref 135–145)

## 2018-02-12 LAB — PROTIME-INR
INR: 1.03
PROTHROMBIN TIME: 13.4 s (ref 11.4–15.2)

## 2018-02-12 MED ORDER — SODIUM CHLORIDE 0.9 % IV SOLN
250.0000 mL | INTRAVENOUS | Status: DC | PRN
Start: 2018-02-12 — End: 2018-02-17
  Administered 2018-02-12: 250 mL via INTRAVENOUS

## 2018-02-12 MED ORDER — ONDANSETRON HCL 4 MG PO TABS: 4.0000 mg | ORAL_TABLET | Freq: Four times a day (QID) | ORAL | Status: DC | PRN

## 2018-02-12 MED ORDER — MORPHINE SULFATE (PF) 2 MG/ML IV SOLN
2.0000 mg | INTRAVENOUS | Status: DC | PRN
  Administered 2018-02-12 – 2018-02-13 (×4): 2 mg via INTRAVENOUS
  Filled 2018-02-12 (×6): qty 1

## 2018-02-12 MED ORDER — SODIUM CHLORIDE 0.9% FLUSH
3.0000 mL | INTRAVENOUS | Status: DC | PRN
Start: 2018-02-12 — End: 2018-02-17

## 2018-02-12 MED ORDER — MORPHINE SULFATE (PF) 4 MG/ML IV SOLN
4.0000 mg | Freq: Once | INTRAVENOUS | Status: AC
  Administered 2018-02-12: 4 mg via INTRAVENOUS
  Filled 2018-02-12: qty 1

## 2018-02-12 MED ORDER — ACETAMINOPHEN 325 MG PO TABS: 650.0000 mg | ORAL_TABLET | Freq: Four times a day (QID) | ORAL | Status: DC | PRN

## 2018-02-12 MED ORDER — SODIUM CHLORIDE 0.9% FLUSH
3.0000 mL | Freq: Two times a day (BID) | INTRAVENOUS | Status: DC
  Administered 2018-02-12 – 2018-02-17 (×9): 3 mL via INTRAVENOUS

## 2018-02-12 MED ORDER — BUSPIRONE HCL 5 MG PO TABS
10.0000 mg | ORAL_TABLET | Freq: Every day | ORAL | Status: DC
  Administered 2018-02-13 – 2018-02-17 (×5): 10 mg via ORAL
  Filled 2018-02-12 (×5): qty 2

## 2018-02-12 MED ORDER — AMPICILLIN-SULBACTAM SODIUM 3 (2-1) G IJ SOLR
3.0000 g | Freq: Three times a day (TID) | INTRAMUSCULAR | Status: DC
  Administered 2018-02-12 – 2018-02-17 (×14): 3 g via INTRAVENOUS
  Filled 2018-02-12 (×20): qty 3

## 2018-02-12 MED ORDER — METRONIDAZOLE IN NACL 5-0.79 MG/ML-% IV SOLN
500.0000 mg | Freq: Once | INTRAVENOUS | Status: DC
  Filled 2018-02-12: qty 100

## 2018-02-12 MED ORDER — IBUPROFEN 400 MG PO TABS: 400.0000 mg | ORAL_TABLET | Freq: Four times a day (QID) | ORAL | Status: DC | PRN

## 2018-02-12 MED ORDER — ACETAMINOPHEN 500 MG PO TABS
1000.0000 mg | ORAL_TABLET | Freq: Once | ORAL | Status: AC
  Administered 2018-02-12: 1000 mg via ORAL
  Filled 2018-02-12: qty 2

## 2018-02-12 MED ORDER — ONDANSETRON HCL 4 MG/2ML IJ SOLN: 4.0000 mg | Freq: Four times a day (QID) | INTRAMUSCULAR | Status: DC | PRN

## 2018-02-12 MED ORDER — IOPAMIDOL (ISOVUE-300) INJECTION 61%
100.0000 mL | Freq: Once | INTRAVENOUS | Status: AC | PRN
  Administered 2018-02-12: 100 mL via INTRAVENOUS

## 2018-02-12 MED ORDER — ALPRAZOLAM 1 MG PO TABS
1.0000 mg | ORAL_TABLET | Freq: Every evening | ORAL | Status: DC | PRN
  Administered 2018-02-12 – 2018-02-16 (×4): 1 mg via ORAL
  Filled 2018-02-12 (×4): qty 1

## 2018-02-12 MED ORDER — CIPROFLOXACIN IN D5W 400 MG/200ML IV SOLN
400.0000 mg | Freq: Once | INTRAVENOUS | Status: AC
  Administered 2018-02-12: 400 mg via INTRAVENOUS
  Filled 2018-02-12: qty 200

## 2018-02-12 NOTE — H&P (Signed)
History and Physical    Brandon Zavala ZOX:[id number] DOB: Jun 27, 1961 DOA: 02/12/2018  PCP: Patient, No Pcp Per  Patient coming from: Home  Chief Complaint: Left lower quadrant abdominal pain  HPI: Brandon Zavala is a 57 y.o. male with no medical history except recent hospitalization for acute diverticulitis for which she was treated with  Levaquin and Flagyl which she completed a week ago Thursday.  Patient reports he never returned to normal.  He constantly was having left lower quadrant abdominal pain it was improved but did not totally resolve.  He reports for about 2 days after completing the antibiotics he had minimal pain.  However the pain started to come back yesterday and was very severe this morning in his left lower quadrant..  Patient also started to spike fevers.  Patient came to emergency department and has been found to have several abscesses associated with his previous area of diverticulitis and referred for admission for such.  General surgery Dr. Lovell Sheehan has been called who is recommending IR drainage in the morning and subsequent eventual partial colectomy.  Patient has had no other previous episodes of  diverticulitis in the past.  Review of Systems: As per HPI otherwise 10 point review of systems negative.   Past Medical History:  Diagnosis Date  . Diverticulitis   . Elevated bilirubin     Past Surgical History:  Procedure Laterality Date  . APPENDECTOMY       reports that he has quit smoking. He has never used smokeless tobacco. He reports that he drinks alcohol. He reports that he does not use drugs.  No Known Allergies  Family History  Problem Relation Age of Onset  . Hypertension Mother   . COPD Sister   . Hypertension Sister     Prior to Admission medications   Medication Sig Start Date End Date Taking? Authorizing Provider  acetaminophen (TYLENOL) 325 MG tablet Take 2 tablets (650 mg total) by mouth every 6 (six) hours as needed for mild pain (or Fever >/=  101). 01/24/18  Yes Buriev, Isaiah Serge, MD  ALPRAZolam Prudy Feeler) 1 MG tablet Take 1 mg by mouth at bedtime as needed for anxiety.   Yes [provider]  busPIRone (BUSPAR) 10 MG tablet Take 10 mg by mouth daily.   Yes [provider]  ibuprofen (ADVIL,MOTRIN) 200 MG tablet Take 400 mg by mouth every 6 (six) hours as needed.   Yes [provider]    Physical Exam: Vitals:   02/12/18 1407  BP: (!) 179/99  Pulse: (!) 114  Resp: 17  Temp: 99.1 F (37.3 C)  SpO2: 100%      Constitutional: NAD, calm, comfortable Vitals:   02/12/18 1407  BP: (!) 179/99  Pulse: (!) 114  Resp: 17  Temp: 99.1 F (37.3 C)  SpO2: 100%   Eyes: PERRL, lids and conjunctivae normal ENMT: Mucous membranes are moist. Posterior pharynx clear of any exudate or lesions.Normal dentition.  Neck: normal, supple, no masses, no thyromegaly Respiratory: clear to auscultation bilaterally, no wheezing, no crackles. Normal respiratory effort. No accessory muscle use.  Cardiovascular: Regular rate and rhythm, no murmurs / rubs / gallops. No extremity edema. 2+ pedal pulses. No carotid bruits.  Abdomen: Left lower quadrant mild tenderness, no masses palpated. No hepatosplenomegaly. Bowel sounds positive.  Musculoskeletal: no clubbing / cyanosis. No joint deformity upper and lower extremities. Good ROM, no contractures. Normal muscle tone.  Skin: no rashes, lesions, ulcers. No induration Neurologic: CN 2-12 grossly intact. Sensation intact,  DTR normal. Strength 5/5 in all 4.  Psychiatric: Normal judgment and insight. Alert and oriented x 3. Normal mood.    Labs on Admission: I have personally reviewed following labs and imaging studies  CBC: Recent Labs  Lab 02/12/18 1434  WBC 13.6*  NEUTROABS 11.3*  HGB 14.0  HCT 43.1  MCV 93.1  PLT 358   Basic Metabolic Panel: Recent Labs  Lab 02/12/18 1434  NA 136  K 4.0  CL 98*  CO2 24  GLUCOSE 102*  BUN 10  CREATININE 0.91  CALCIUM 9.5    GFR: Estimated Creatinine Clearance: 100.7 mL/min (by C-G formula based on SCr of 0.91 mg/dL). Liver Function Tests: No results for input(s): AST, ALT, ALKPHOS, BILITOT, PROT, ALBUMIN in the last 168 hours. No results for input(s): LIPASE, AMYLASE in the last 168 hours. No results for input(s): AMMONIA in the last 168 hours. Coagulation Profile: No results for input(s): INR, PROTIME in the last 168 hours. Cardiac Enzymes: No results for input(s): CKTOTAL, CKMB, CKMBINDEX, TROPONINI in the last 168 hours. BNP (last 3 results) No results for input(s): PROBNP in the last 8760 hours. HbA1C: No results for input(s): HGBA1C in the last 72 hours. CBG: No results for input(s): GLUCAP in the last 168 hours. Lipid Profile: No results for input(s): CHOL, HDL, LDLCALC, TRIG, CHOLHDL, LDLDIRECT in the last 72 hours. Thyroid Function Tests: No results for input(s): TSH, T4TOTAL, FREET4, T3FREE, THYROIDAB in the last 72 hours. Anemia Panel: No results for input(s): VITAMINB12, FOLATE, FERRITIN, TIBC, IRON, RETICCTPCT in the last 72 hours. Urine analysis:    Component Value Date/Time   COLORURINE STRAW (A) 02/12/2018 1455   APPEARANCEUR CLEAR 02/12/2018 1455   LABSPEC 1.004 (L) 02/12/2018 1455   PHURINE 7.0 02/12/2018 1455   GLUCOSEU NEGATIVE 02/12/2018 1455   HGBUR SMALL (A) 02/12/2018 1455   BILIRUBINUR NEGATIVE 02/12/2018 1455   KETONESUR NEGATIVE 02/12/2018 1455   PROTEINUR NEGATIVE 02/12/2018 1455   NITRITE NEGATIVE 02/12/2018 1455   LEUKOCYTESUR NEGATIVE 02/12/2018 1455   Sepsis Labs: !!!!!!!!!!!!!!!!!!!!!!!!!!!!!!!!!!!!!!!!!!!! (procalcitonin:4,lacticidven:4) )No results found for this or any previous visit (from the past 240 hour(s)).   Radiological Exams on Admission: Ct Abdomen Pelvis W Contrast  Result Date: 02/12/2018 CLINICAL DATA:  Low right pelvic pain, possible diverticulitis EXAM: CT ABDOMEN AND PELVIS WITH CONTRAST TECHNIQUE: Multidetector CT imaging of the  abdomen and pelvis was performed using the standard protocol following bolus administration of intravenous contrast. CONTRAST:  ISOVUE-300 IOPAMIDOL (ISOVUE-300) INJECTION 61% COMPARISON:  CT abdomen pelvis of 4 levin 2019 FINDINGS: Lower chest: The lung bases are clear. The heart appears to be within normal limits in size. Coronary artery calcifications are noted in the distribution of of the left anterior descending and right coronary arteries. Hepatobiliary: The liver enhances with no focal abnormality and no ductal dilatation is seen. No calcified gallstones are noted. Pancreas: The pancreas is normal in size in the pancreatic duct is not dilated. The peripancreatic fat planes are well preserved. Spleen: The spleen is unremarkable. Adrenals/Urinary Tract: The adrenal glands appear normal. The kidneys enhance with no calculus or mass. No hydronephrosis is seen on delayed images. The ureters appear normal in caliber. The urinary bladder is moderately distended with no abnormality noted. Stomach/Bowel: The stomach is decompressed and cannot be well evaluated. No small bowel abnormality is seen. However, there is acute diverticulitis of the sigmoid colon anteriorly and to the left of midline. There is evidence of perforation and pericolonic abscess formation. The largest of the abscesses  measures 2.7 x 4.3 cm on image 73 series 2. And adjacent abscess measures 2.2 x 2.9 cm. There is considerable inflammatory reaction with strandiness of the pericolonic fat planes and thickening of the mucosa of the sigmoid colon. The more proximal colon is unremarkable. The terminal ileum appears normal. The appendix has previously been resected by history. Vascular/Lymphatic: The abdominal aorta is normal in caliber with moderate abdominal aortic atherosclerosis noted. No adenopathy is seen. Reproductive: The prostate is normal in size. Other: A tiny amount of fluid layers within the pelvis and within the right lower quadrant  presumably due to the inflammatory response to the diverticulitis and diverticular abscesses. Musculoskeletal: The lumbar vertebrae are normal alignment with diffuse degenerative disc disease most marked at L5-S1. The SI joints are well corticated IMPRESSION: 1. Complicated acute diverticulitis of the sigmoid colon with mucosal edema, pericolonic strandiness, as well as evidence of perforation and pericolonic abscess formation as described above. 2. Coronary artery calcifications. 3. Moderate abdominal aortic atherosclerosis. 4. Tiny amount of fluid layers within the pelvis and right lower quadrant presumably due to the acute diverticulitis. Electronically Signed   By: Dwyane Dee M.D.   On: 02/12/2018 17:06    Old chart reviewed  Case discussed with EDP Dr. Rennis Chris   Assessment/Plan 57 year old male with complicated diverticulitis with several abscesses Principal Problem:   Diverticulitis of intestine with abscess without bleeding-having order for IR to drain in the morning.  Patient be transferred to [facility] and return back to [facility] for further management.  General surgery is also been consulted Dr. Lovell Sheehan will see also in the morning and after his infection is cool down with antibiotics will likely proceed with partial colectomy after drainage of the abscesses by IR.  Patient is afebrile right now vital signs are stable.  Will place on Unasyn.  Active Problems:   Abdominal pain, acute, left lower quadrant-due to the above   Elevated bilirubin-patient has had history of elevated bilirubin levels last week this is been repeated and normal at this time   DVT prophylaxis: SCDs Code Status: Full Family Communication: None Disposition Plan: Per day team Consults called: General surgery and IR Admission status: Admission   DAVID,RACHAL A MD [facility]  If 7PM-7AM, please contact night-coverage [email or web address] Password [facility]  02/12/2018, 6:12 PM

## 2018-02-12 NOTE — ED Provider Notes (Signed)
[facility] EMERGENCY DEPARTMENT Provider Note   CSN: [id number] Arrival date & time: 02/12/18  1353     History   Chief Complaint Chief Complaint  Patient presents with  . Abdominal Pain    hx diverticulitis    HPI Brandon Zavala is a 57 y.o. male.  Complains of left lower quadrant pain , nonradiating onset 01/21/2018.  Pain is worse with changing positions and improved with remaining still.  Also worse with placing pressure on the area.  He denies any urinary symptoms.  Pain feels like diverticulitis he has had in the past.  He was hospitalized here on 01/22/2018 for sigmoid diverticulitis with extraluminal gas.  He received IV antibiotics and was sent home on oral antibiotics which he completed 1 week ago.  He felt much better yesterday he awakened this morning with severe pain.  He treated himself with ibuprofen this morning, with minimal relief.  Associated symptoms include chills.  No fever.  No nausea or vomiting.  No urinary symptoms.  No other associated symptoms  HPI  Past Medical History:  Diagnosis Date  . Diverticulitis     Patient Active Problem List   Diagnosis Date Noted  . Diverticulitis of large intestine with perforation without abscess or bleeding 01/22/2018    Past Surgical History:  Procedure Laterality Date  . APPENDECTOMY          Home Medications    Prior to Admission medications   Medication Sig Start Date End Date Taking? Authorizing Provider  acetaminophen (TYLENOL) 325 MG tablet Take 2 tablets (650 mg total) by mouth every 6 (six) hours as needed for mild pain (or Fever >/= 101). 01/24/18   Esperanza Sheets, MD    Family History Family History  Problem Relation Age of Onset  . Hypertension Mother   . COPD Sister   . Hypertension Sister     Social History Social History   Tobacco Use  . Smoking status: Former Games developer  . Smokeless tobacco: Never Used  Substance Use Topics  . Alcohol use: Yes    Frequency: Never  . Drug use: Never      Allergies   Patient has no known allergies.   Review of Systems Review of Systems  Constitutional: Positive for chills.  HENT: Negative.   Respiratory: Negative.   Cardiovascular: Negative.   Gastrointestinal: Positive for abdominal pain.  Musculoskeletal: Negative.   Skin: Negative.   Neurological: Negative.   Psychiatric/Behavioral: Negative.   All other systems reviewed and are negative.    Physical Exam Updated Vital Signs BP (!) 179/99 (BP Location: Left Arm)   Pulse (!) 114   Temp 99.1 F (37.3 C)   Resp 17   SpO2 100%   Physical Exam  Constitutional: He appears well-developed and well-nourished.  HENT:  Head: Normocephalic and atraumatic.  Eyes: Pupils are equal, round, and reactive to light. Conjunctivae are normal.  Neck: Neck supple. No tracheal deviation present. No thyromegaly present.  Cardiovascular: Regular rhythm.  No murmur heard. Mild tachycardia Pulse at 110 bpm by me  Pulmonary/Chest: Effort normal and breath sounds normal.  Abdominal: Soft. Bowel sounds are normal. He exhibits no distension. There is tenderness. There is guarding.  Tender with voluntary guarding at left lower quadrant  Genitourinary: Penis normal.  Genitourinary Comments: Normal male genitalia  Musculoskeletal: Normal range of motion. He exhibits no edema or tenderness.  Neurological: He is alert. Coordination normal.  Skin: Skin is warm and dry. No rash noted.  Psychiatric: He has  a normal mood and affect.  Nursing note and vitals reviewed.    ED Treatments / Results  Labs (all labs ordered are listed, but only abnormal results are displayed) Labs Reviewed  BASIC METABOLIC PANEL  CBC WITH DIFFERENTIAL/PLATELET  URINALYSIS, ROUTINE W REFLEX MICROSCOPIC    EKG None  Radiology No results found.  Procedures Procedures (including critical care time)  Medications Ordered in ED Medications - No data to display  Results for orders placed or performed during  the hospital encounter of 02/12/18  Basic metabolic panel  Result Value Ref Range   Sodium 136 135 - 145 mmol/L   Potassium 4.0 3.5 - 5.1 mmol/L   Chloride 98 (L) 101 - 111 mmol/L   CO2 24 22 - 32 mmol/L   Glucose, Bld 102 (H) 65 - 99 mg/dL   BUN 10 6 - 20 mg/dL   Creatinine, Ser 1.61 0.61 - 1.24 mg/dL   Calcium 9.5 8.9 - 09.6 mg/dL   GFR calc non Af Amer >60 >60 mL/min   GFR calc Af Amer >60 >60 mL/min   Anion gap 14 5 - 15  CBC with Differential/Platelet  Result Value Ref Range   WBC 13.6 (H) 4.0 - 10.5 K/uL   RBC 4.63 4.22 - 5.81 MIL/uL   Hemoglobin 14.0 13.0 - 17.0 g/dL   HCT 04.5 40.9 - 81.1 %   MCV 93.1 78.0 - 100.0 fL   MCH 30.2 26.0 - 34.0 pg   MCHC 32.5 30.0 - 36.0 g/dL   RDW 91.4 78.2 - 95.6 %   Platelets 358 150 - 400 K/uL   Neutrophils Relative % 83 %   Neutro Abs 11.3 (H) 1.7 - 7.7 K/uL   Lymphocytes Relative 9 %   Lymphs Abs 1.2 0.7 - 4.0 K/uL   Monocytes Relative 8 %   Monocytes Absolute 1.1 (H) 0.1 - 1.0 K/uL   Eosinophils Relative 0 %   Eosinophils Absolute 0.0 0.0 - 0.7 K/uL   Basophils Relative 0 %   Basophils Absolute 0.0 0.0 - 0.1 K/uL  Urinalysis, Routine w reflex microscopic  Result Value Ref Range   Color, Urine STRAW (A) YELLOW   APPearance CLEAR CLEAR   Specific Gravity, Urine 1.004 (L) 1.005 - 1.030   pH 7.0 5.0 - 8.0   Glucose, UA NEGATIVE NEGATIVE mg/dL   Hgb urine dipstick SMALL (A) NEGATIVE   Bilirubin Urine NEGATIVE NEGATIVE   Ketones, ur NEGATIVE NEGATIVE mg/dL   Protein, ur NEGATIVE NEGATIVE mg/dL   Nitrite NEGATIVE NEGATIVE   Leukocytes, UA NEGATIVE NEGATIVE   RBC / HPF 0-5 0 - 5 RBC/hpf   WBC, UA 0-5 0 - 5 WBC/hpf   Bacteria, UA NONE SEEN NONE SEEN   Squamous Epithelial / LPF 0-5 0 - 5   Ct Abdomen Pelvis W Contrast  Result Date: 02/12/2018 CLINICAL DATA:  Low right pelvic pain, possible diverticulitis EXAM: CT ABDOMEN AND PELVIS WITH CONTRAST TECHNIQUE: Multidetector CT imaging of the abdomen and pelvis was performed using the  standard protocol following bolus administration of intravenous contrast. CONTRAST:  ISOVUE-300 IOPAMIDOL (ISOVUE-300) INJECTION 61% COMPARISON:  CT abdomen pelvis of 4 levin 2019 FINDINGS: Lower chest: The lung bases are clear. The heart appears to be within normal limits in size. Coronary artery calcifications are noted in the distribution of of the left anterior descending and right coronary arteries. Hepatobiliary: The liver enhances with no focal abnormality and no ductal dilatation is seen. No calcified gallstones are noted. Pancreas: The pancreas  is normal in size in the pancreatic duct is not dilated. The peripancreatic fat planes are well preserved. Spleen: The spleen is unremarkable. Adrenals/Urinary Tract: The adrenal glands appear normal. The kidneys enhance with no calculus or mass. No hydronephrosis is seen on delayed images. The ureters appear normal in caliber. The urinary bladder is moderately distended with no abnormality noted. Stomach/Bowel: The stomach is decompressed and cannot be well evaluated. No small bowel abnormality is seen. However, there is acute diverticulitis of the sigmoid colon anteriorly and to the left of midline. There is evidence of perforation and pericolonic abscess formation. The largest of the abscesses measures 2.7 x 4.3 cm on image 73 series 2. And adjacent abscess measures 2.2 x 2.9 cm. There is considerable inflammatory reaction with strandiness of the pericolonic fat planes and thickening of the mucosa of the sigmoid colon. The more proximal colon is unremarkable. The terminal ileum appears normal. The appendix has previously been resected by history. Vascular/Lymphatic: The abdominal aorta is normal in caliber with moderate abdominal aortic atherosclerosis noted. No adenopathy is seen. Reproductive: The prostate is normal in size. Other: A tiny amount of fluid layers within the pelvis and within the right lower quadrant presumably due to the inflammatory  response to the diverticulitis and diverticular abscesses. Musculoskeletal: The lumbar vertebrae are normal alignment with diffuse degenerative disc disease most marked at L5-S1. The SI joints are well corticated IMPRESSION: 1. Complicated acute diverticulitis of the sigmoid colon with mucosal edema, pericolonic strandiness, as well as evidence of perforation and pericolonic abscess formation as described above. 2. Coronary artery calcifications. 3. Moderate abdominal aortic atherosclerosis. 4. Tiny amount of fluid layers within the pelvis and right lower quadrant presumably due to the acute diverticulitis. Electronically Signed   By: Dwyane Dee M.D.   On: 02/12/2018 17:06   Ct Abdomen Pelvis W Contrast  Result Date: 01/22/2018 CLINICAL DATA:  Mid central abdominal pain starting yesterday. Diverticulitis suspected EXAM: CT ABDOMEN AND PELVIS WITH CONTRAST TECHNIQUE: Multidetector CT imaging of the abdomen and pelvis was performed using the standard protocol following bolus administration of intravenous contrast. CONTRAST:  ISOVUE-300 IOPAMIDOL (ISOVUE-300) INJECTION 61% COMPARISON:  None. FINDINGS: Lower chest: Normal heart size without pericardial effusion. Clear lung bases. Hepatobiliary: No focal liver abnormality is seen. No gallstones, gallbladder wall thickening, or biliary dilatation. Pancreas: Unremarkable. No pancreatic ductal dilatation or surrounding inflammatory changes. Spleen: Normal in size without focal abnormality. Adrenals/Urinary Tract: Adrenal glands are unremarkable. Kidneys are normal, without renal calculi, focal lesion, or hydronephrosis. Bladder is unremarkable. Stomach/Bowel: Decompressed stomach with normal small bowel rotation. Mild sympathetic ileus of jejunal loops adjacent to pericolonic inflammation in the left lower quadrant and pelvis secondary to acute diverticulitis. Tiny contained extraluminal gas is seen within the mesenteric fat ventral to the proximal sigmoid. No  abscess. 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 No lymphadenopathy. Reproductive: Top-normal size prostate. Unremarkable seminal vesicles. Other: No free fluid. Musculoskeletal: Multilevel degenerative disc disease from L2 through S1. No acute osseous abnormality. No pars defects or listhesis. IMPRESSION: Acute sigmoid diverticulitis with moderate degree of pericolonic inflammation and contained tiny foci of extraluminal gas from presumed perforated diverticulum, series 2/71. No abscess or bowel obstruction. Sympathetic distention and ileus of adjacent jejunal loops from the pericolonic inflammation. These results were called by telephone at the time of interpretation on 01/22/2018 at 9:58 pm to Dr. Benjiman Core , who verbally acknowledged these results. Electronically Signed   By: Tollie Eth M.D.   On: 01/22/2018 21:58  Initial Impression / Assessment and Plan / ED Course  I have reviewed the triage vital signs and the nursing notes.  Pertinent labs & imaging results that were available during my care of the patient were reviewed by me and considered in my medical decision making (see chart for details).     3:25 PM pain much improved after treatment with intravenous morphine 4 mg 5:40 PM patient requesting more pain medicine additional morphine ordered.  Intravenous antibiotics, Cipro and Flagyl ordered by me.  I consulted Dr. Lovell Sheehan, general surgery who will see patient in the hospital tomorrow.  He is suggesting the patient being admitted to hospitalist service.  He will need interventional radiology tomorrow to drain percutaneously the abscesses.  He suggest n.p.o. after midnight.  Patient will need INR For interventional radiologist.  Ordered by me.  I consulted Dr. Onalee Hua who will arrange for admission.  6 PM pain much improved.  Tylenol ordered for fever 101.1 Final Clinical Impressions(s) / ED Diagnoses  Diagnosis #1diverticulitis of large intestine with  abscess, without bleeding #2 fever Final diagnoses:  None    ED Discharge Orders    None       Doug Sou, MD 02/12/18 1805

## 2018-02-12 NOTE — ED Triage Notes (Signed)
Pt here x 1 month ago for diverticulitis. C/o same pain to LLQ. Denies nv/d.

## 2018-02-12 NOTE — Progress Notes (Signed)
Pharmacy Note:  Initial antibiotic(s) regimen of Unasyn ordered by EDP to treat intra-abdominal infection.  Estimated Creatinine Clearance: 100.7 mL/min (by C-G formula based on SCr of 0.91 mg/dL).   No Known Allergies  Vitals:   02/12/18 1407 02/12/18 1836  BP: (!) 179/99 (!) 153/97  Pulse: (!) 114 (!) 115  Resp: 17 17  Temp: 99.1 F (37.3 C) 100.1 F (37.8 C)  SpO2: 100% 98%    Anti-infectives (From admission, onward)   Start     Dose/Rate Route Frequency Ordered Stop   02/12/18 2000  Ampicillin-Sulbactam (UNASYN) 3 g in sodium chloride 0.9 % 100 mL IVPB     3 g 200 mL/hr over 30 Minutes Intravenous Every 8 hours 02/12/18 1934     02/12/18 1745  ciprofloxacin (CIPRO) IVPB 400 mg     400 mg 200 mL/hr over 60 Minutes Intravenous  Once 02/12/18 1735 02/12/18 1842   02/12/18 1745  metroNIDAZOLE (FLAGYL) IVPB 500 mg  Status:  Discontinued     500 mg 100 mL/hr over 60 Minutes Intravenous  Once 02/12/18 1735 02/12/18 1815     Plan: Initial dose(s) of Unasyn 3gm IV q8h ordered. F/U admission orders for any changes to therapy.   Wayland Denis, [facility] 02/12/2018 7:34 PM

## 2018-02-13 ENCOUNTER — Other Ambulatory Visit: Payer: Self-pay

## 2018-02-13 DIAGNOSIS — F419 Anxiety disorder, unspecified: Secondary | ICD-10-CM

## 2018-02-13 DIAGNOSIS — K5720 Diverticulitis of large intestine with perforation and abscess without bleeding: Principal | ICD-10-CM

## 2018-02-13 LAB — COMPREHENSIVE METABOLIC PANEL
ALT: 13 U/L — AB (ref 17–63)
ANION GAP: 10 (ref 5–15)
AST: 13 U/L — ABNORMAL LOW (ref 15–41)
Albumin: 3.2 g/dL — ABNORMAL LOW (ref 3.5–5.0)
Alkaline Phosphatase: 64 U/L (ref 38–126)
BUN: 9 mg/dL (ref 6–20)
CHLORIDE: 99 mmol/L — AB (ref 101–111)
CO2: 27 mmol/L (ref 22–32)
CREATININE: 0.94 mg/dL (ref 0.61–1.24)
Calcium: 8.7 mg/dL — ABNORMAL LOW (ref 8.9–10.3)
Glucose, Bld: 107 mg/dL — ABNORMAL HIGH (ref 65–99)
POTASSIUM: 4.3 mmol/L (ref 3.5–5.1)
SODIUM: 136 mmol/L (ref 135–145)
Total Bilirubin: 1.6 mg/dL — ABNORMAL HIGH (ref 0.3–1.2)
Total Protein: 6.6 g/dL (ref 6.5–8.1)

## 2018-02-13 LAB — CBC
HEMATOCRIT: 39.5 % (ref 39.0–52.0)
HEMOGLOBIN: 12.5 g/dL — AB (ref 13.0–17.0)
MCH: 29.9 pg (ref 26.0–34.0)
MCHC: 31.6 g/dL (ref 30.0–36.0)
MCV: 94.5 fL (ref 78.0–100.0)
Platelets: 345 10*3/uL (ref 150–400)
RBC: 4.18 MIL/uL — AB (ref 4.22–5.81)
RDW: 13.2 % (ref 11.5–15.5)
WBC: 14.2 10*3/uL — AB (ref 4.0–10.5)

## 2018-02-13 MED ORDER — HEPARIN SODIUM (PORCINE) 5000 UNIT/ML IJ SOLN
5000.0000 [IU] | Freq: Three times a day (TID) | INTRAMUSCULAR | Status: DC
  Administered 2018-02-13 – 2018-02-17 (×10): 5000 [IU] via SUBCUTANEOUS
  Filled 2018-02-13 (×10): qty 1

## 2018-02-13 MED ORDER — MORPHINE SULFATE (PF) 2 MG/ML IV SOLN
2.0000 mg | INTRAVENOUS | Status: DC | PRN
  Administered 2018-02-13 – 2018-02-17 (×15): 2 mg via INTRAVENOUS
  Filled 2018-02-13 (×16): qty 1

## 2018-02-13 NOTE — Progress Notes (Signed)
PROGRESS NOTE    Brandon Zavala  ZOX:[id number] DOB: April 14, 1961 DOA: 02/12/2018 PCP: Patient, No Pcp Per    Brief Narrative:  57 year old male with a recent hospitalization for acute diverticulitis and treated with Levaquin and Flagyl, admitted to the hospital with left lower quadrant abdominal pain.  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  On intravenous antibiotics.  General surgery following.  He will need continued IV antibiotics for the next 2 to 3 days followed by repeat CT scan to evaluate if abscess has adequately developed for drain placement per interventional radiology.   Assessment & Plan:   Principal Problem:   Diverticulitis of intestine with abscess without bleeding Active Problems:   Abdominal pain, acute, left lower quadrant   Elevated bilirubin   1. Sigmoid diverticulitis with perforation and abscess.  Images reviewed with interventional radiology it was not felt that abscess cavities were adequately formed yet for drain placement.  He will be continued on intravenous antibiotics for the next 2 to 3 days after which CT scan will be repeated.  General surgery following.  Continue on clear liquids for now.  Since this is a recurrent issue, he will likely need definitive surgical resection once acute issues have resolved 2. Anxiety.  Continue BuSpar as needed Xanax   DVT prophylaxis: Heparin Code Status: Full code Family Communication: No family present Disposition Plan: Discharge home once improved   Consultants:   General surgery  Procedures:     Antimicrobials:   Unasyn 5/2 >   Subjective: No vomiting.  No diarrhea.  Continues to have tenderness in left lower quadrant  Objective: Vitals:   02/12/18 1407 02/12/18 1836 02/12/18 2120 02/13/18 0547  BP: (!) 179/99 (!) 153/97 125/73 127/89  Pulse: (!) 114 (!) 115 96 (!) 113  Resp: Temp: 99.1 F (37.3 C) 100.1 F (37.8 C) 99.4 F (37.4 C) 100.2 F (37.9 C)    TempSrc:  Oral Oral Oral  SpO2: 100% 98% 97% 96%  Weight:    88.9 kg (196 lb)    Intake/Output Summary (Last 24 hours) at 02/13/2018 1816 Last data filed at 02/13/2018 0510 Gross per 24 hour  Intake 522 ml  Output 800 ml  Net -278 ml   Filed Weights   02/13/18 0547  Weight: 88.9 kg (196 lb)    Examination:  General exam: Appears calm and comfortable  Respiratory system: Clear to auscultation. Respiratory effort normal. Cardiovascular system: S1 & S2 heard, RRR. No JVD, murmurs, rubs, gallops or clicks. No pedal edema. Gastrointestinal system: Abdomen is nondistended, soft and tender in left lower quadrant. No organomegaly or masses felt. Normal bowel sounds heard. Central nervous system: Alert and oriented. No focal neurological deficits. Extremities: Symmetric 5 x 5 power. Skin: No rashes, lesions or ulcers Psychiatry: Judgement and insight appear normal. Mood & affect appropriate.     Data Reviewed: I have personally reviewed following labs and imaging studies  CBC: Recent Labs  Lab 02/12/18 1434 02/13/18 0452  WBC 13.6* 14.2*  NEUTROABS 11.3*  --   HGB 14.0 12.5*  HCT 43.1 39.5  MCV 93.1 94.5  PLT 358 345   Basic Metabolic Panel: Recent Labs  Lab 02/12/18 1434 02/13/18 0452  NA 136 136  K 4.0 4.3  CL 98* 99*  CO2 24 27  GLUCOSE 102* 107*  BUN 10 9  CREATININE 0.91 0.94  CALCIUM 9.5 8.7*   GFR: Estimated Creatinine Clearance: 97.5 mL/min (by C-G formula based on SCr  of 0.94 mg/dL). Liver Function Tests: Recent Labs  Lab 02/12/18 1812 02/13/18 0452  AST 17 13*  ALT 15* 13*  ALKPHOS 73 64  BILITOT 1.4* 1.6*  PROT 8.1 6.6  ALBUMIN 4.1 3.2*   No results for input(s): LIPASE, AMYLASE in the last 168 hours. No results for input(s): AMMONIA in the last 168 hours. Coagulation Profile: Recent Labs  Lab 02/12/18 1756  INR 1.03   Cardiac Enzymes: No results for input(s): CKTOTAL, CKMB, CKMBINDEX, TROPONINI in the last 168 hours. BNP (last 3  results) No results for input(s): PROBNP in the last 8760 hours. HbA1C: No results for input(s): HGBA1C in the last 72 hours. CBG: No results for input(s): GLUCAP in the last 168 hours. Lipid Profile: No results for input(s): CHOL, HDL, LDLCALC, TRIG, CHOLHDL, LDLDIRECT in the last 72 hours. Thyroid Function Tests: No results for input(s): TSH, T4TOTAL, FREET4, T3FREE, THYROIDAB in the last 72 hours. Anemia Panel: No results for input(s): VITAMINB12, FOLATE, FERRITIN, TIBC, IRON, RETICCTPCT in the last 72 hours. Sepsis Labs: No results for input(s): PROCALCITON, LATICACIDVEN in the last 168 hours.  No results found for this or any previous visit (from the past 240 hour(s)).       Radiology Studies: Ct Abdomen Pelvis W Contrast  Result Date: 02/12/2018 CLINICAL DATA:  Low right pelvic pain, possible diverticulitis EXAM: CT ABDOMEN AND PELVIS WITH CONTRAST TECHNIQUE: Multidetector CT imaging of the abdomen and pelvis was performed using the standard protocol following bolus administration of intravenous contrast. CONTRAST:  ISOVUE-300 IOPAMIDOL (ISOVUE-300) INJECTION 61% COMPARISON:  CT abdomen pelvis of 4 levin 2019 FINDINGS: Lower chest: The lung bases are clear. The heart appears to be within normal limits in size. Coronary artery calcifications are noted in the distribution of of the left anterior descending and right coronary arteries. Hepatobiliary: The liver enhances with no focal abnormality and no ductal dilatation is seen. No calcified gallstones are noted. Pancreas: The pancreas is normal in size in the pancreatic duct is not dilated. The peripancreatic fat planes are well preserved. Spleen: The spleen is unremarkable. Adrenals/Urinary Tract: The adrenal glands appear normal. The kidneys enhance with no calculus or mass. No hydronephrosis is seen on delayed images. The ureters appear normal in caliber. The urinary bladder is moderately distended with no abnormality noted.  Stomach/Bowel: The stomach is decompressed and cannot be well evaluated. No small bowel abnormality is seen. However, there is acute diverticulitis of the sigmoid colon anteriorly and to the left of midline. There is evidence of perforation and pericolonic abscess formation. The largest of the abscesses measures 2.7 x 4.3 cm on image 73 series 2. And adjacent abscess measures 2.2 x 2.9 cm. There is considerable inflammatory reaction with strandiness of the pericolonic fat planes and thickening of the mucosa of the sigmoid colon. The more proximal colon is unremarkable. The terminal ileum appears normal. The appendix has previously been resected by history. Vascular/Lymphatic: The abdominal aorta is normal in caliber with moderate abdominal aortic atherosclerosis noted. No adenopathy is seen. Reproductive: The prostate is normal in size. Other: A tiny amount of fluid layers within the pelvis and within the right lower quadrant presumably due to the inflammatory response to the diverticulitis and diverticular abscesses. Musculoskeletal: The lumbar vertebrae are normal alignment with diffuse degenerative disc disease most marked at L5-S1. The SI joints are well corticated IMPRESSION: 1. Complicated acute diverticulitis of the sigmoid colon with mucosal edema, pericolonic strandiness, as well as evidence of perforation and pericolonic abscess formation as  described above. 2. Coronary artery calcifications. 3. Moderate abdominal aortic atherosclerosis. 4. Tiny amount of fluid layers within the pelvis and right lower quadrant presumably due to the acute diverticulitis. Electronically Signed   By: Dwyane Dee M.D.   On: 02/12/2018 17:06        Scheduled Meds: . busPIRone  10 mg Oral Daily  . sodium chloride flush  3 mL Intravenous Q12H   Continuous Infusions: . sodium chloride 10 mL/hr at 02/13/18 0510  . ampicillin-sulbactam (UNASYN) IV Stopped (02/13/18 1245)     LOS: 1 day    Time spent: 30  minutes    Erick Blinks, MD [facility] Pager [phone]  If 7PM-7AM, please contact night-coverage [email or web address] Password [facility] 02/13/2018, 6:16 PM

## 2018-02-13 NOTE — Progress Notes (Signed)
Patient ID: Brandon Zavala, male   DOB: 11/06/1960, 57 y.o.   MRN: [id number]   Request for diverticular abscess drain placement  I have reviewed imaging with Dr Fredia Sorrow These collections ar not well formed abscesses Placing drain now would be difficult--- drain unlikely to form into collection  Rec: Continued IV antibiotic repeat CT scan in 2-3 days  If collections are more well formed and more accessible to drain placement - we could attempt drainage at that time  Discussed with Dr Kerry Hough

## 2018-02-13 NOTE — Consult Note (Signed)
Reason for Consult: Sigmoid diverticulitis with perforation and abscess Referring Physician: Dr. Dewayne Hatch is an 57 y.o. male.  HPI: Patient is a 57 year old white male with a known history of sigmoid diverticulitis who is just finished his antibiotics last week and was seen by [facility] who presented with a 24-hour history of worsening left lower quadrant abdominal pain.  CT scan of the abdomen revealed worsening sigmoid diverticulitis as compared to his previous CT scan.  2 fluid collections were noted around the sigmoid colon.  His leukocytosis was mild at 14,000.  He was admitted to the hospital for consideration of interventional radiologic drainage.  He was also started on Ambien antibiotics and bowel rest.  He states his pain is controlled with morphine for only 2 hours.  He states his pain is 8 out of 10 when palpated in that region.  He does have crampy abdominal pain that comes in waves.  He denies any fevers overnight.  Past Medical History:  Diagnosis Date  . Diverticulitis   . Elevated bilirubin     Past Surgical History:  Procedure Laterality Date  . APPENDECTOMY      Family History  Problem Relation Age of Onset  . Hypertension Mother   . COPD Sister   . Hypertension Sister     Social History:  reports that he has quit smoking. He has never used smokeless tobacco. He reports that he drinks alcohol. He reports that he does not use drugs.  Allergies: No Known Allergies  Medications: I have reviewed the patient's current medications.  Results for orders placed or performed during the hospital encounter of 02/12/18 (from the past 48 hour(s))  Basic metabolic panel     Status: Abnormal   Collection Time: 02/12/18  2:34 PM  Result Value Ref Range   Sodium 136 135 - 145 mmol/L   Potassium 4.0 3.5 - 5.1 mmol/L   Chloride 98 (L) 101 - 111 mmol/L   CO2 24 22 - 32 mmol/L   Glucose, Bld 102 (H) 65 - 99 mg/dL   BUN 10 6 - 20 mg/dL   Creatinine, Ser 0.91  0.61 - 1.24 mg/dL   Calcium 9.5 8.9 - 10.3 mg/dL   GFR calc non Af Amer >60 >60 mL/min   GFR calc Af Amer >60 >60 mL/min    Comment: (NOTE) The eGFR has been calculated using the CKD EPI equation. This calculation has not been validated in all clinical situations. eGFR's persistently <60 mL/min signify possible Chronic Kidney Disease.    Anion gap 14 5 - 15    Comment: Performed at [facility], [address] [place], [place] [address]  CBC with Differential/Platelet     Status: Abnormal   Collection Time: 02/12/18  2:34 PM  Result Value Ref Range   WBC 13.6 (H) 4.0 - 10.5 K/uL   RBC 4.63 4.22 - 5.81 MIL/uL   Hemoglobin 14.0 13.0 - 17.0 g/dL   HCT 43.1 39.0 - 52.0 %   MCV 93.1 78.0 - 100.0 fL   MCH 30.2 26.0 - 34.0 pg   MCHC 32.5 30.0 - 36.0 g/dL   RDW 13.0 11.5 - 15.5 %   Platelets 358 150 - 400 K/uL   Neutrophils Relative % 83 %   Neutro Abs 11.3 (H) 1.7 - 7.7 K/uL   Lymphocytes Relative 9 %   Lymphs Abs 1.2 0.7 - 4.0 K/uL   Monocytes Relative 8 %   Monocytes Absolute 1.1 (H) 0.1 - 1.0 K/uL  Eosinophils Relative 0 %   Eosinophils Absolute 0.0 0.0 - 0.7 K/uL   Basophils Relative 0 %   Basophils Absolute 0.0 0.0 - 0.1 K/uL    Comment: Performed at [facility], [address] [place], [place] [address]  Urinalysis, Routine w reflex microscopic     Status: Abnormal   Collection Time: 02/12/18  2:55 PM  Result Value Ref Range   Color, Urine STRAW (A) YELLOW   APPearance CLEAR CLEAR   Specific Gravity, Urine 1.004 (L) 1.005 - 1.030   pH 7.0 5.0 - 8.0   Glucose, UA NEGATIVE NEGATIVE mg/dL   Hgb urine dipstick SMALL (A) NEGATIVE   Bilirubin Urine NEGATIVE NEGATIVE   Ketones, ur NEGATIVE NEGATIVE mg/dL   Protein, ur NEGATIVE NEGATIVE mg/dL   Nitrite NEGATIVE NEGATIVE   Leukocytes, UA NEGATIVE NEGATIVE   RBC / HPF 0-5 0 - 5 RBC/hpf   WBC, UA 0-5 0 - 5 WBC/hpf   Bacteria, UA NONE SEEN NONE SEEN   Squamous Epithelial / LPF 0-5 0 - 5    Comment: Please note change in  reference range. Performed at [facility], [address], [place], [place] [address]   Protime-INR     Status: None   Collection Time: 02/12/18  5:56 PM  Result Value Ref Range   Prothrombin Time 13.4 11.4 - 15.2 seconds   INR 1.03     Comment: Performed at [facility], [address], [place], [place] [address]  Hepatic function panel     Status: Abnormal   Collection Time: 02/12/18  6:12 PM  Result Value Ref Range   Total Protein 8.1 6.5 - 8.1 g/dL   Albumin 4.1 3.5 - 5.0 g/dL   AST 17 15 - 41 U/L   ALT 15 (L) 17 - 63 U/L   Alkaline Phosphatase 73 38 - 126 U/L   Total Bilirubin 1.4 (H) 0.3 - 1.2 mg/dL   Bilirubin, Direct 0.2 0.1 - 0.5 mg/dL   Indirect Bilirubin 1.2 (H) 0.3 - 0.9 mg/dL    Comment: Performed at [facility], [address], [place], [place] [address]  Comprehensive metabolic panel     Status: Abnormal   Collection Time: 02/13/18  4:52 AM  Result Value Ref Range   Sodium 136 135 - 145 mmol/L   Potassium 4.3 3.5 - 5.1 mmol/L   Chloride 99 (L) 101 - 111 mmol/L   CO2 27 22 - 32 mmol/L   Glucose, Bld 107 (H) 65 - 99 mg/dL   BUN 9 6 - 20 mg/dL   Creatinine, Ser 0.94 0.61 - 1.24 mg/dL   Calcium 8.7 (L) 8.9 - 10.3 mg/dL   Total Protein 6.6 6.5 - 8.1 g/dL   Albumin 3.2 (L) 3.5 - 5.0 g/dL   AST 13 (L) 15 - 41 U/L   ALT 13 (L) 17 - 63 U/L   Alkaline Phosphatase 64 38 - 126 U/L   Total Bilirubin 1.6 (H) 0.3 - 1.2 mg/dL   GFR calc non Af Amer >60 >60 mL/min   GFR calc Af Amer >60 >60 mL/min    Comment: (NOTE) The eGFR has been calculated using the CKD EPI equation. This calculation has not been validated in all clinical situations. eGFR's persistently <60 mL/min signify possible Chronic Kidney Disease.    Anion gap 10 5 - 15    Comment: Performed at [facility], [address], [place], [place] [address]  CBC     Status: Abnormal   Collection Time: 02/13/18  4:52 AM  Result Value Ref Range   WBC 14.2 (H) 4.0 - 10.5 K/uL   RBC 4.18 (L) 4.22 - 5.81 MIL/uL    Hemoglobin 12.5 (L) 13.0 - 17.0 g/dL   HCT 39.5 39.0 - 52.0 %   MCV 94.5 78.0 - 100.0 fL   MCH 29.9 26.0 - 34.0 pg   MCHC 31.6 30.0 - 36.0 g/dL   RDW 13.2 11.5 - 15.5 %   Platelets 345 150 - 400 K/uL    Comment: Performed at [facility], [address] [place], [place] [address]    Ct Abdomen Pelvis W Contrast  Result Date: 02/12/2018 CLINICAL DATA:  Low right pelvic pain, possible diverticulitis EXAM: CT ABDOMEN AND PELVIS WITH CONTRAST TECHNIQUE: Multidetector CT imaging of the abdomen and pelvis was performed using the standard protocol following bolus administration of intravenous contrast. CONTRAST:  165m ISOVUE-300 IOPAMIDOL (ISOVUE-300) INJECTION 61% COMPARISON:  CT abdomen pelvis of 4 levin 2019 FINDINGS: Lower chest: The lung bases are clear. The heart appears to be within normal limits in size. Coronary artery calcifications are noted in the distribution of of the left anterior descending and right coronary arteries. Hepatobiliary: The liver enhances with no focal abnormality and no ductal dilatation is seen. No calcified gallstones are noted. Pancreas: The pancreas is normal in size in the pancreatic duct is not dilated. The peripancreatic fat planes are well preserved. Spleen: The spleen is unremarkable. Adrenals/Urinary Tract: The adrenal glands appear normal. The kidneys enhance with no calculus or mass. No hydronephrosis is seen on delayed images. The ureters appear normal in caliber. The urinary bladder is moderately distended with no abnormality noted. Stomach/Bowel: The stomach is decompressed and cannot be well evaluated. No small bowel abnormality is seen. However, there is acute diverticulitis of the sigmoid colon anteriorly and to the left of midline. There is evidence of perforation and pericolonic abscess formation. The largest of the abscesses measures 2.7 x 4.3 cm on image 73 series 2. And adjacent abscess measures 2.2 x 2.9 cm. There is considerable inflammatory reaction with  strandiness of the pericolonic fat planes and thickening of the mucosa of the sigmoid colon. The more proximal colon is unremarkable. The terminal ileum appears normal. The appendix has previously been resected by history. Vascular/Lymphatic: The abdominal aorta is normal in caliber with moderate abdominal aortic atherosclerosis noted. No adenopathy is seen. Reproductive: The prostate is normal in size. Other: A tiny amount of fluid layers within the pelvis and within the right lower quadrant presumably due to the inflammatory response to the diverticulitis and diverticular abscesses. Musculoskeletal: The lumbar vertebrae are normal alignment with diffuse degenerative disc disease most marked at L5-S1. The SI joints are well corticated IMPRESSION: 1. Complicated acute diverticulitis of the sigmoid colon with mucosal edema, pericolonic strandiness, as well as evidence of perforation and pericolonic abscess formation as described above. 2. Coronary artery calcifications. 3. Moderate abdominal aortic atherosclerosis. 4. Tiny amount of fluid layers within the pelvis and right lower quadrant presumably due to the acute diverticulitis. Electronically Signed   By: PIvar DrapeM.D.   On: 02/12/2018 17:06    ROS:  Pertinent items are noted in HPI.  Blood pressure 127/89, pulse (!) 113, temperature 100.2 F (37.9 C), temperature source Oral, resp. rate 15, weight 196 lb (88.9 kg), SpO2 96 %. Physical Exam: Well-developed well-nourished white male no acute distress Head is normocephalic, atraumatic Lungs clear to auscultation with equal breath sounds bilaterally Heart examination reveals regular rate and rhythm without S3,  S4, murmurs Abdomen is soft and flat with tenderness in the left lower quadrant to palpation.  No diffuse rigidity noted.  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  Would continue IV Zosyn and clear liquid diet.  I have increased his morphine to q2 hours.  We will follow with you.  Will need follow-up CT scan in 3 days.  Aviva Signs 02/13/2018, 11:16 AM

## 2018-02-14 LAB — BASIC METABOLIC PANEL
ANION GAP: 12 (ref 5–15)
BUN: 9 mg/dL (ref 6–20)
CO2: 28 mmol/L (ref 22–32)
CREATININE: 0.98 mg/dL (ref 0.61–1.24)
Calcium: 9.1 mg/dL (ref 8.9–10.3)
Chloride: 98 mmol/L — ABNORMAL LOW (ref 101–111)
GFR calc non Af Amer: >60 mL/min (ref >60)
Glucose, Bld: 105 mg/dL — ABNORMAL HIGH (ref 65–99)
Potassium: 4.7 mmol/L (ref 3.5–5.1)
Sodium: 138 mmol/L (ref 135–145)

## 2018-02-14 LAB — CBC
HCT: 42.1 % (ref 39.0–52.0)
Hemoglobin: 13 g/dL (ref 13.0–17.0)
MCH: 29.4 pg (ref 26.0–34.0)
MCHC: 30.9 g/dL (ref 30.0–36.0)
MCV: 95.2 fL (ref 78.0–100.0)
Platelets: 347 10*3/uL (ref 150–400)
RBC: 4.42 MIL/uL (ref 4.22–5.81)
RDW: 13 % (ref 11.5–15.5)
WBC: 9.6 10*3/uL (ref 4.0–10.5)

## 2018-02-14 NOTE — Progress Notes (Signed)
PROGRESS NOTE    Brandon Zavala  ZOX:[id number] DOB: [birth year] DOA: 02/12/2018 PCP: Patient, No Pcp Per    Brief Narrative:  57 year old male with a recent hospitalization for acute diverticulitis and treated with Levaquin and Flagyl, admitted to the hospital with left lower quadrant abdominal pain.  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  On intravenous antibiotics.  General surgery following.  He will need continued IV antibiotics for the next 2 to 3 days followed by repeat CT scan to evaluate if abscess has adequately developed for drain placement per interventional radiology.   Assessment & Plan:   Principal Problem:   Diverticulitis of intestine with abscess without bleeding Active Problems:   Abdominal pain, acute, left lower quadrant   Elevated bilirubin   1. Sigmoid diverticulitis with perforation and abscess.  Images reviewed with interventional radiology it was not felt that abscess cavities were adequately formed yet for drain placement.  He will be continued on intravenous antibiotics for the next 2 to 3 days after which CT scan will be repeated.  General surgery following.  Patient is tolerating clear liquids and will be advanced to full liquids.  Since this is a recurrent issue, he will likely need definitive surgical resection once acute issues have resolved.  Continue current treatments 2. Anxiety.  Continue BuSpar and as needed Xanax   DVT prophylaxis: Heparin Code Status: Full code Family Communication: No family present Disposition Plan: Discharge home once improved   Consultants:   General surgery  Procedures:     Antimicrobials:   Unasyn 5/2 >   Subjective: Abdominal pain is improving today.  Having flatus.  No bowel movement.  No vomiting.  Objective: Vitals:   02/13/18 2044 02/13/18 2228 02/14/18 0558 02/14/18 1418  BP: (!) 151/98  (!) 161/93 (!) 137/91  Pulse: 94  87 97  Resp: Temp: 99.2 F (37.3 C)  98.5  F (36.9 C) 98.7 F (37.1 C)  TempSrc: Oral  Oral   SpO2: 98%  99% 100%  Weight:  88.9 kg (196 lb) 85.9 kg (189 lb 6.4 oz)   Height:  6' (1.829 m)      Intake/Output Summary (Last 24 hours) at 02/14/2018 1555 Last data filed at 02/14/2018 1505 Gross per 24 hour  Intake 979.17 ml  Output -  Net 979.17 ml   Filed Weights   02/13/18 0547 02/13/18 2228 02/14/18 0558  Weight: 88.9 kg (196 lb) 88.9 kg (196 lb) 85.9 kg (189 lb 6.4 oz)    Examination:  General exam: Alert, awake, oriented x 3 Respiratory system: Clear to auscultation. Respiratory effort normal. Cardiovascular system:RRR. No murmurs, rubs, gallops. Gastrointestinal system: Abdomen is nondistended, soft and mild tenderness in the left lower quadrant. No organomegaly or masses felt. Normal bowel sounds heard. Central nervous system: Alert and oriented. No focal neurological deficits. Extremities: No C/C/E, +pedal pulses Skin: No rashes, lesions or ulcers Psychiatry: Judgement and insight appear normal. Mood & affect appropriate.   Data Reviewed: I have personally reviewed following labs and imaging studies  CBC: Recent Labs  Lab 02/12/18 1434 02/13/18 0452 02/14/18 0607  WBC 13.6* 14.2* 9.6  NEUTROABS 11.3*  --   --   HGB 14.0 12.5* 13.0  HCT 43.1 39.5 42.1  MCV 93.1 94.5 95.2  PLT 358 345 347   Basic Metabolic Panel: Recent Labs  Lab 02/12/18 1434 02/13/18 0452 02/14/18 0607  NA 136 136 138  K 4.0 4.3 4.7  CL 98* 99*  98*  CO2 GLUCOSE 102* 107* 105*  BUN CREATININE 0.91 0.94 0.98  CALCIUM 9.5 8.7* 9.1   GFR: Estimated Creatinine Clearance: 93.5 mL/min (by C-G formula based on SCr of 0.98 mg/dL). Liver Function Tests: Recent Labs  Lab 02/12/18 1812 02/13/18 0452  AST 17 13*  ALT 15* 13*  ALKPHOS 73 64  BILITOT 1.4* 1.6*  PROT 8.1 6.6  ALBUMIN 4.1 3.2*   No results for input(s): LIPASE, AMYLASE in the last 168 hours. No results for input(s): AMMONIA in the last 168  hours. Coagulation Profile: Recent Labs  Lab 02/12/18 1756  INR 1.03   Cardiac Enzymes: No results for input(s): CKTOTAL, CKMB, CKMBINDEX, TROPONINI in the last 168 hours. BNP (last 3 results) No results for input(s): PROBNP in the last 8760 hours. HbA1C: No results for input(s): HGBA1C in the last 72 hours. CBG: No results for input(s): GLUCAP in the last 168 hours. Lipid Profile: No results for input(s): CHOL, HDL, LDLCALC, TRIG, CHOLHDL, LDLDIRECT in the last 72 hours. Thyroid Function Tests: No results for input(s): TSH, T4TOTAL, FREET4, T3FREE, THYROIDAB in the last 72 hours. Anemia Panel: No results for input(s): VITAMINB12, FOLATE, FERRITIN, TIBC, IRON, RETICCTPCT in the last 72 hours. Sepsis Labs: No results for input(s): PROCALCITON, LATICACIDVEN in the last 168 hours.  No results found for this or any previous visit (from the past 240 hour(s)).       Radiology Studies: Ct Abdomen Pelvis W Contrast  Result Date: 02/12/2018 CLINICAL DATA:  Low right pelvic pain, possible diverticulitis EXAM: CT ABDOMEN AND PELVIS WITH CONTRAST TECHNIQUE: Multidetector CT imaging of the abdomen and pelvis was performed using the standard protocol following bolus administration of intravenous contrast. CONTRAST:  ISOVUE-300 IOPAMIDOL (ISOVUE-300) INJECTION 61% COMPARISON:  CT abdomen pelvis of 4 levin 2019 FINDINGS: Lower chest: The lung bases are clear. The heart appears to be within normal limits in size. Coronary artery calcifications are noted in the distribution of of the left anterior descending and right coronary arteries. Hepatobiliary: The liver enhances with no focal abnormality and no ductal dilatation is seen. No calcified gallstones are noted. Pancreas: The pancreas is normal in size in the pancreatic duct is not dilated. The peripancreatic fat planes are well preserved. Spleen: The spleen is unremarkable. Adrenals/Urinary Tract: The adrenal glands appear normal. The kidneys  enhance with no calculus or mass. No hydronephrosis is seen on delayed images. The ureters appear normal in caliber. The urinary bladder is moderately distended with no abnormality noted. Stomach/Bowel: The stomach is decompressed and cannot be well evaluated. No small bowel abnormality is seen. However, there is acute diverticulitis of the sigmoid colon anteriorly and to the left of midline. There is evidence of perforation and pericolonic abscess formation. The largest of the abscesses measures 2.7 x 4.3 cm on image 73 series 2. And adjacent abscess measures 2.2 x 2.9 cm. There is considerable inflammatory reaction with strandiness of the pericolonic fat planes and thickening of the mucosa of the sigmoid colon. The more proximal colon is unremarkable. The terminal ileum appears normal. The appendix has previously been resected by history. Vascular/Lymphatic: The abdominal aorta is normal in caliber with moderate abdominal aortic atherosclerosis noted. No adenopathy is seen. Reproductive: The prostate is normal in size. Other: A tiny amount of fluid layers within the pelvis and within the right lower quadrant presumably due to the inflammatory response to the diverticulitis and diverticular abscesses. Musculoskeletal: The lumbar vertebrae are normal alignment  with diffuse degenerative disc disease most marked at L5-S1. The SI joints are well corticated IMPRESSION: 1. Complicated acute diverticulitis of the sigmoid colon with mucosal edema, pericolonic strandiness, as well as evidence of perforation and pericolonic abscess formation as described above. 2. Coronary artery calcifications. 3. Moderate abdominal aortic atherosclerosis. 4. Tiny amount of fluid layers within the pelvis and right lower quadrant presumably due to the acute diverticulitis. Electronically Signed   By: Dwyane Dee M.D.   On: 02/12/2018 17:06        Scheduled Meds: . busPIRone  10 mg Oral Daily  . heparin injection (subcutaneous)   5,000 Units Subcutaneous Q8H  . sodium chloride flush  3 mL Intravenous Q12H   Continuous Infusions: . sodium chloride 10 mL/hr at 02/13/18 0510  . ampicillin-sulbactam (UNASYN) IV Stopped (02/14/18 1255)     LOS: 2 days    Time spent: 30 minutes    Erick Blinks, MD [facility] Pager [phone]  If 7PM-7AM, please contact night-coverage [email or web address] Password [email or web address] 02/14/2018, 3:55 PM

## 2018-02-14 NOTE — Progress Notes (Signed)
Pharmacy Antibiotic Note  Brandon Zavala is a 57 y.o. male admitted on 02/12/2018 with intra-abdominal infection.  Pharmacy has been consulted for unasyn dosing. Patient improving. afebrile, WBC normalized. Plan repeat CT scan of abdomen in 24-48hrs. Advancing diet. Plan: Continue Unasyn 3gm IV q8h F/U cxs and clinical progress and deescalation Monitor V/S, labs   Height: 6' (182.9 cm) Weight: 189 lb 6.4 oz (85.9 kg) IBW/kg (Calculated) : 77.6  Temp (24hrs), Avg:98.9 F (37.2 C), Min:98.5 F (36.9 C), Max:99.2 F (37.3 C)  Recent Labs  Lab 02/12/18 1434 02/13/18 0452 02/14/18 0607  WBC 13.6* 14.2* 9.6  CREATININE 0.91 0.94 0.98    Estimated Creatinine Clearance: 93.5 mL/min (by C-G formula based on SCr of 0.98 mg/dL).    No Known Allergies  Antimicrobials this admission: Unasyn 5/2 >>   Dose adjustments this admission: N/A  Microbiology results: No cxs  Thank you for allowing pharmacy to be a part of this patient's care.  Elder Cyphers, BS Pharm D, [place] Clinical Pharmacist Pager [phone] 02/14/2018 11:24 AM

## 2018-02-14 NOTE — Progress Notes (Signed)
Subjective: Patient feels better.  Has less lower abdominal pain.  Is passing gas.  Tolerating clear liquid diet well.  Objective: Vital signs in last 24 hours: Temp:  [98.5 F (36.9 C)-99.2 F (37.3 C)] 98.5 F (36.9 C) (05/04 0558) Pulse Rate:  [87-94] 87 (05/04 0558) Resp:  [16-18] 18 (05/04 0558) BP: (151-161)/(93-98) 161/93 (05/04 0558) SpO2:  [92 %-99 %] 99 % (05/04 0558) Weight:  [189 lb 6.4 oz (85.9 kg)-196 lb (88.9 kg)] 189 lb 6.4 oz (85.9 kg) (05/04 0558) Last BM Date: 02/12/18  Intake/Output from previous day: 05/03 0701 - 05/04 0700 In: 776 [P.O.:240; I.V.:236; IV Piggyback:300] Out: -  Intake/Output this shift: No intake/output data recorded.  General appearance: alert, cooperative and no distress GI: Soft pain to palpation left lower quadrant.  No rigidity noted.  Bowel sounds present., minimal  Lab Results:  Recent Labs    02/13/18 0452 02/14/18 0607  WBC 14.2* 9.6  HGB 12.5* 13.0  HCT 39.5 42.1  PLT 345 347   BMET Recent Labs    02/13/18 0452 02/14/18 0607  NA 136 138  K 4.3 4.7  CL 99* 98*  CO2 27 28  GLUCOSE 107* 105*  BUN 9 9  CREATININE 0.94 0.98  CALCIUM 8.7* 9.1   PT/INR Recent Labs    02/12/18 1756  LABPROT 13.4  INR 1.03    Studies/Results: Ct Abdomen Pelvis W Contrast  Result Date: 02/12/2018 CLINICAL DATA:  Low right pelvic pain, possible diverticulitis EXAM: CT ABDOMEN AND PELVIS WITH CONTRAST TECHNIQUE: Multidetector CT imaging of the abdomen and pelvis was performed using the standard protocol following bolus administration of intravenous contrast. CONTRAST:  ISOVUE-300 IOPAMIDOL (ISOVUE-300) INJECTION 61% COMPARISON:  CT abdomen pelvis of 4 levin 2019 FINDINGS: Lower chest: The lung bases are clear. The heart appears to be within normal limits in size. Coronary artery calcifications are noted in the distribution of of the left anterior descending and right coronary arteries. Hepatobiliary: The liver enhances with no  focal abnormality and no ductal dilatation is seen. No calcified gallstones are noted. Pancreas: The pancreas is normal in size in the pancreatic duct is not dilated. The peripancreatic fat planes are well preserved. Spleen: The spleen is unremarkable. Adrenals/Urinary Tract: The adrenal glands appear normal. The kidneys enhance with no calculus or mass. No hydronephrosis is seen on delayed images. The ureters appear normal in caliber. The urinary bladder is moderately distended with no abnormality noted. Stomach/Bowel: The stomach is decompressed and cannot be well evaluated. No small bowel abnormality is seen. However, there is acute diverticulitis of the sigmoid colon anteriorly and to the left of midline. There is evidence of perforation and pericolonic abscess formation. The largest of the abscesses measures 2.7 x 4.3 cm on image 73 series 2. And adjacent abscess measures 2.2 x 2.9 cm. There is considerable inflammatory reaction with strandiness of the pericolonic fat planes and thickening of the mucosa of the sigmoid colon. The more proximal colon is unremarkable. The terminal ileum appears normal. The appendix has previously been resected by history. Vascular/Lymphatic: The abdominal aorta is normal in caliber with moderate abdominal aortic atherosclerosis noted. No adenopathy is seen. Reproductive: The prostate is normal in size. Other: A tiny amount of fluid layers within the pelvis and within the right lower quadrant presumably due to the inflammatory response to the diverticulitis and diverticular abscesses. Musculoskeletal: The lumbar vertebrae are normal alignment with diffuse degenerative disc disease most marked at L5-S1. The SI joints are well corticated IMPRESSION:  1. Complicated acute diverticulitis of the sigmoid colon with mucosal edema, pericolonic strandiness, as well as evidence of perforation and pericolonic abscess formation as described above. 2. Coronary artery calcifications. 3. Moderate  abdominal aortic atherosclerosis. 4. Tiny amount of fluid layers within the pelvis and right lower quadrant presumably due to the acute diverticulitis. Electronically Signed   By: Dwyane Dee M.D.   On: 02/12/2018 17:06    Anti-infectives: Anti-infectives (From admission, onward)   Start     Dose/Rate Route Frequency Ordered Stop   02/12/18 2000  Ampicillin-Sulbactam (UNASYN) 3 g in sodium chloride 0.9 % 100 mL IVPB     3 g 200 mL/hr over 30 Minutes Intravenous Every 8 hours 02/12/18 1934     02/12/18 1745  ciprofloxacin (CIPRO) IVPB 400 mg     400 mg 200 mL/hr over 60 Minutes Intravenous  Once 02/12/18 1735 02/12/18 1842   02/12/18 1745  metroNIDAZOLE (FLAGYL) IVPB 500 mg  Status:  Discontinued     500 mg 100 mL/hr over 60 Minutes Intravenous  Once 02/12/18 1735 02/12/18 1815      Assessment/Plan: Impression: Sigmoid diverticulitis with perforation, improving.  Leukocytosis resolved. Plan: We will advance to full liquid diet.  Anticipate repeat CT scan of abdomen and pelvis in the next 24 to 48 hours.  LOS: 2 days    Franky Macho 02/14/2018

## 2018-02-15 NOTE — Plan of Care (Signed)
progressing 

## 2018-02-15 NOTE — Progress Notes (Signed)
  Subjective: Patient feels much better.  His left lower quadrant abdominal pain has significantly improved.  Objective: Vital signs in last 24 hours: Temp:  [98.2 F (36.8 C)-98.7 F (37.1 C)] 98.2 F (36.8 C) (05/05 0541) Pulse Rate:  [82-98] 82 (05/05 0541) Resp:  [16-20] 17 (05/05 0541) BP: (137-155)/(88-107) 141/88 (05/05 0541) SpO2:  [95 %-100 %] 97 % (05/05 0541) Weight:  [189 lb 9.6 oz (86 kg)] 189 lb 9.6 oz (86 kg) (05/05 0541) Last BM Date: 02/15/18  Intake/Output from previous day: 05/04 0701 - 05/05 0700 In: 203.2 [I.V.:103.2; IV Piggyback:100] Out: -  Intake/Output this shift: No intake/output data recorded.  General appearance: alert, cooperative and no distress GI: Soft with minimal tenderness noted left lower quadrant to deep palpation.  No rigidity noted.  Bowel sounds present.  Lab Results:  Recent Labs    02/13/18 0452 02/14/18 0607  WBC 14.2* 9.6  HGB 12.5* 13.0  HCT 39.5 42.1  PLT 345 347   BMET Recent Labs    02/13/18 0452 02/14/18 0607  NA 136 138  K 4.3 4.7  CL 99* 98*  CO2 27 28  GLUCOSE 107* 105*  BUN 9 9  CREATININE 0.94 0.98  CALCIUM 8.7* 9.1   PT/INR Recent Labs    02/12/18 1756  LABPROT 13.4  INR 1.03    Studies/Results: No results found.  Anti-infectives: Anti-infectives (From admission, onward)   Start     Dose/Rate Route Frequency Ordered Stop   02/12/18 2000  Ampicillin-Sulbactam (UNASYN) 3 g in sodium chloride 0.9 % 100 mL IVPB     3 g 200 mL/hr over 30 Minutes Intravenous Every 8 hours 02/12/18 1934     02/12/18 1745  ciprofloxacin (CIPRO) IVPB 400 mg     400 mg 200 mL/hr over 60 Minutes Intravenous  Once 02/12/18 1735 02/12/18 1842   02/12/18 1745  metroNIDAZOLE (FLAGYL) IVPB 500 mg  Status:  Discontinued     500 mg 100 mL/hr over 60 Minutes Intravenous  Once 02/12/18 1735 02/12/18 1815      Assessment/Plan: Impression: Sigmoid diverticulitis with perforation and abscess, resolving. Plan: Will advance  to soft diet.  Repeat CT scan of abdomen tomorrow.  Further management is pending those results.  LOS: 3 days    Brandon Zavala 02/15/2018

## 2018-02-15 NOTE — Progress Notes (Signed)
PROGRESS NOTE    Brandon Zavala  GNF:[id number] DOB: June 14, 1961 DOA: 02/12/2018 PCP: Patient, No Pcp Per    Brief Narrative:  57 year old male with a recent hospitalization for acute diverticulitis and treated with Levaquin and Flagyl, admitted to the hospital with left lower quadrant abdominal pain.  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  On intravenous antibiotics.  General surgery following.  He will need continued IV antibiotics for the next 2 to 3 days followed by repeat CT scan to evaluate if abscess has adequately developed for drain placement per interventional radiology.   Assessment & Plan:   Principal Problem:   Diverticulitis of intestine with abscess without bleeding Active Problems:   Abdominal pain, acute, left lower quadrant   Elevated bilirubin   1. Sigmoid diverticulitis with perforation and abscess.  Images reviewed with interventional radiology it was not felt that abscess cavities were adequately formed yet for drain placement.  He has been continued on intravenous antibiotics since admission.  Plan is to repeat CT scan in a.m.  General surgery following.  Patient is tolerating full liquids at this time and will be advanced to soft diet.  We will keep patient n.p.o. after midnight in case drain placement is planned after CT scan tomorrow..  Since this is a recurrent issue, he will likely need definitive surgical resection once acute issues have resolved.  Continue current treatments 2. Anxiety.  Continue BuSpar and as needed Xanax   DVT prophylaxis: Heparin Code Status: Full code Family Communication: No family present Disposition Plan: Discharge home once improved   Consultants:   General surgery  Procedures:     Antimicrobials:   Unasyn 5/2 >   Subjective: Abdominal pain continues to improve.  Had a bowel movement.  No vomiting.  Tolerating liquids.  Objective: Vitals:   02/14/18 1953 02/14/18 2112 02/15/18 0541 02/15/18  1400  BP:  (!) 155/107 (!) 141/88 (!) 149/93  Pulse:  98 82 77  Resp:  Temp:  98.5 F (36.9 C) 98.2 F (36.8 C) 98.3 F (36.8 C)  TempSrc:  Oral Oral Oral  SpO2: 99% 95% 97% 99%  Weight:   86 kg (189 lb 9.6 oz)   Height:        Intake/Output Summary (Last 24 hours) at 02/15/2018 1757 Last data filed at 02/15/2018 1300 Gross per 24 hour  Intake 240 ml  Output -  Net 240 ml   Filed Weights   02/13/18 2228 02/14/18 0558 02/15/18 0541  Weight: 88.9 kg (196 lb) 85.9 kg (189 lb 6.4 oz) 86 kg (189 lb 9.6 oz)    Examination:  General exam: Alert, awake, oriented x 3 Respiratory system: Clear to auscultation. Respiratory effort normal. Cardiovascular system:RRR. No murmurs, rubs, gallops. Gastrointestinal system: Abdomen is nondistended, soft and mild tenderness in the left lower quadrant. No organomegaly or masses felt. Normal bowel sounds heard. Central nervous system: Alert and oriented. No focal neurological deficits. Extremities: No C/C/E, +pedal pulses Skin: No rashes, lesions or ulcers Psychiatry: Judgement and insight appear normal. Mood & affect appropriate.   Data Reviewed: I have personally reviewed following labs and imaging studies  CBC: Recent Labs  Lab 02/12/18 1434 02/13/18 0452 02/14/18 0607  WBC 13.6* 14.2* 9.6  NEUTROABS 11.3*  --   --   HGB 14.0 12.5* 13.0  HCT 43.1 39.5 42.1  MCV 93.1 94.5 95.2  PLT 358 345 347   Basic Metabolic Panel: Recent Labs  Lab 02/12/18 1434 02/13/18 0452  02/14/18 0607  NA 136 136 138  K 4.0 4.3 4.7  CL 98* 99* 98*  CO2 GLUCOSE 102* 107* 105*  BUN CREATININE 0.91 0.94 0.98  CALCIUM 9.5 8.7* 9.1   GFR: Estimated Creatinine Clearance: 93.5 mL/min (by C-G formula based on SCr of 0.98 mg/dL). Liver Function Tests: Recent Labs  Lab 02/12/18 1812 02/13/18 0452  AST 17 13*  ALT 15* 13*  ALKPHOS 73 64  BILITOT 1.4* 1.6*  PROT 8.1 6.6  ALBUMIN 4.1 3.2*   No results for input(s):  LIPASE, AMYLASE in the last 168 hours. No results for input(s): AMMONIA in the last 168 hours. Coagulation Profile: Recent Labs  Lab 02/12/18 1756  INR 1.03   Cardiac Enzymes: No results for input(s): CKTOTAL, CKMB, CKMBINDEX, TROPONINI in the last 168 hours. BNP (last 3 results) No results for input(s): PROBNP in the last 8760 hours. HbA1C: No results for input(s): HGBA1C in the last 72 hours. CBG: No results for input(s): GLUCAP in the last 168 hours. Lipid Profile: No results for input(s): CHOL, HDL, LDLCALC, TRIG, CHOLHDL, LDLDIRECT in the last 72 hours. Thyroid Function Tests: No results for input(s): TSH, T4TOTAL, FREET4, T3FREE, THYROIDAB in the last 72 hours. Anemia Panel: No results for input(s): VITAMINB12, FOLATE, FERRITIN, TIBC, IRON, RETICCTPCT in the last 72 hours. Sepsis Labs: No results for input(s): PROCALCITON, LATICACIDVEN in the last 168 hours.  No results found for this or any previous visit (from the past 240 hour(s)).       Radiology Studies: No results found.      Scheduled Meds: . busPIRone  10 mg Oral Daily  . heparin injection (subcutaneous)  5,000 Units Subcutaneous Q8H  . sodium chloride flush  3 mL Intravenous Q12H   Continuous Infusions: . sodium chloride 10 mL/hr at 02/13/18 0510  . ampicillin-sulbactam (UNASYN) IV Stopped (02/15/18 1243)     LOS: 3 days    Time spent: 25 minutes    Erick Blinks, MD [facility] Pager [phone]  If 7PM-7AM, please contact night-coverage [email or web address] Password [facility] 02/15/2018, 5:57 PM

## 2018-02-16 ENCOUNTER — Inpatient Hospital Stay (HOSPITAL_COMMUNITY): Payer: Medicaid - Out of State

## 2018-02-16 ENCOUNTER — Ambulatory Visit (HOSPITAL_COMMUNITY)
Admit: 2018-02-16 | Discharge: 2018-02-16 | Disposition: A | Discharge status: Home / Self Care | Payer: Medicaid - Out of State | Attending: Family Medicine | Admitting: Family Medicine

## 2018-02-16 DIAGNOSIS — K5720 Diverticulitis of large intestine with perforation and abscess without bleeding: Secondary | ICD-10-CM

## 2018-02-16 DIAGNOSIS — Z79899 Other long term (current) drug therapy: Secondary | ICD-10-CM | POA: Insufficient documentation

## 2018-02-16 DIAGNOSIS — Z87891 Personal history of nicotine dependence: Secondary | ICD-10-CM

## 2018-02-16 MED ORDER — FENTANYL CITRATE (PF) 100 MCG/2ML IJ SOLN
INTRAMUSCULAR | Status: AC
  Filled 2018-02-16: qty 4

## 2018-02-16 MED ORDER — MIDAZOLAM HCL 2 MG/2ML IJ SOLN
INTRAMUSCULAR | Status: AC | PRN
  Administered 2018-02-16: 1 mg via INTRAVENOUS
  Administered 2018-02-16: 0.5 mg via INTRAVENOUS

## 2018-02-16 MED ORDER — IBUPROFEN 400 MG PO TABS: 400.0000 mg | ORAL_TABLET | Freq: Four times a day (QID) | ORAL | Status: DC | PRN

## 2018-02-16 MED ORDER — MIDAZOLAM HCL 2 MG/2ML IJ SOLN
INTRAMUSCULAR | Status: AC
  Filled 2018-02-16: qty 4

## 2018-02-16 MED ORDER — SODIUM CHLORIDE 0.9% FLUSH
5.0000 mL | Freq: Three times a day (TID) | INTRAVENOUS | Status: DC
  Administered 2018-02-16 – 2018-02-17 (×2): 5 mL

## 2018-02-16 MED ORDER — IOPAMIDOL (ISOVUE-300) INJECTION 61%
100.0000 mL | Freq: Once | INTRAVENOUS | Status: AC | PRN
  Administered 2018-02-16: 100 mL via INTRAVENOUS

## 2018-02-16 MED ORDER — LIDOCAINE HCL 1 % IJ SOLN
INTRAMUSCULAR | Status: AC
  Filled 2018-02-16: qty 20

## 2018-02-16 MED ORDER — IOPAMIDOL (ISOVUE-300) INJECTION 61%
30.0000 mL | Freq: Once | INTRAVENOUS | Status: AC | PRN
  Administered 2018-02-16: 30 mL via ORAL

## 2018-02-16 MED ORDER — FENTANYL CITRATE (PF) 100 MCG/2ML IJ SOLN
INTRAMUSCULAR | Status: AC | PRN
  Administered 2018-02-16: 25 ug via INTRAVENOUS
  Administered 2018-02-16: 50 ug via INTRAVENOUS

## 2018-02-16 NOTE — Progress Notes (Signed)
PROGRESS NOTE    Brandon Zavala  ZOX:[id number] DOB: August 29, 1961 DOA: 02/12/2018 PCP: Patient, No Pcp Per    Brief Narrative:  57 year old male with a recent hospitalization for acute diverticulitis and treated with Levaquin and Flagyl, admitted to the hospital with left lower quadrant abdominal pain.  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  On intravenous antibiotics.  General surgery following.  He will need continued IV antibiotics for the next 2 to 3 days followed by repeat CT scan to evaluate if abscess has adequately developed for drain placement per interventional radiology.  Assessment & Plan:   Principal Problem:   Diverticulitis of intestine with abscess without bleeding Active Problems:   Abdominal pain, acute, left lower quadrant   Elevated bilirubin   1. Sigmoid diverticulitis with perforation and abscess.  Images reviewed with interventional radiology it was not felt that abscess cavities were adequately formed yet for drain placement.  He has been continued on intravenous antibiotics since admission.  Repeat CT scan this morning pending.  General surgery following.  Will defer management the them pending CT findings.  Continue IV antibiotics for now. 2. Generalized Anxiety Disorder.  Continue BuSpar and as needed Xanax. 3. Leukocytosis - WBC has come back down to normal range.    DVT prophylaxis: Heparin Code Status: Full code Family Communication: No family present Disposition Plan: pending surgical consult recommendations    Consultants:   General surgery  I.R. (radiology)  Procedures:     Antimicrobials:   Unasyn 5/2 >   Subjective: Abdominal pain continues to improve.  He had another BM.   Objective: Vitals:   02/15/18 1400 02/15/18 2215 02/16/18 0551 02/16/18 0552  BP: (!) 149/93 (!) 152/93  (!) 159/96  Pulse: 77 80  66  Resp: Temp: 98.3 F (36.8 C) 98.3 F (36.8 C)  98 F (36.7 C)  TempSrc: Oral Oral  Oral   SpO2: 99% 97%  98%  Weight:   85.6 kg (188 lb 12.8 oz)   Height:        Intake/Output Summary (Last 24 hours) at 02/16/2018 1000 Last data filed at 02/16/2018 0900 Gross per 24 hour  Intake 240 ml  Output 300 ml  Net -60 ml   Filed Weights   02/14/18 0558 02/15/18 0541 02/16/18 0551  Weight: 85.9 kg (189 lb 6.4 oz) 86 kg (189 lb 9.6 oz) 85.6 kg (188 lb 12.8 oz)    Examination:  General exam: Alert, awake, oriented x 3 Respiratory system: Clear to auscultation. Respiratory effort normal. Cardiovascular system:RRR. No murmurs, rubs, gallops. Gastrointestinal system: Abdomen is nondistended, soft and mild tenderness in the left lower quadrant. No organomegaly or masses felt. Normal bowel sounds heard. Central nervous system: Alert and oriented. No focal neurological deficits. Extremities: No C/C/E, +pedal pulses Skin: No rashes, lesions or ulcers Psychiatry: Judgement and insight appear normal. Mood & affect appropriate.   Data Reviewed: I have personally reviewed following labs and imaging studies  CBC: Recent Labs  Lab 02/12/18 1434 02/13/18 0452 02/14/18 0607  WBC 13.6* 14.2* 9.6  NEUTROABS 11.3*  --   --   HGB 14.0 12.5* 13.0  HCT 43.1 39.5 42.1  MCV 93.1 94.5 95.2  PLT 358 345 347   Basic Metabolic Panel: Recent Labs  Lab 02/12/18 1434 02/13/18 0452 02/14/18 0607  NA 136 136 138  K 4.0 4.3 4.7  CL 98* 99* 98*  CO2 GLUCOSE 102* 107* 105*  BUN  CREATININE 0.91 0.94 0.98  CALCIUM 9.5 8.7* 9.1   GFR: Estimated Creatinine Clearance: 93.5 mL/min (by C-G formula based on SCr of 0.98 mg/dL). Liver Function Tests: Recent Labs  Lab 02/12/18 1812 02/13/18 0452  AST 17 13*  ALT 15* 13*  ALKPHOS 73 64  BILITOT 1.4* 1.6*  PROT 8.1 6.6  ALBUMIN 4.1 3.2*   No results for input(s): LIPASE, AMYLASE in the last 168 hours. No results for input(s): AMMONIA in the last 168 hours. Coagulation Profile: Recent Labs  Lab 02/12/18 1756  INR 1.03    Cardiac Enzymes: No results for input(s): CKTOTAL, CKMB, CKMBINDEX, TROPONINI in the last 168 hours. BNP (last 3 results) No results for input(s): PROBNP in the last 8760 hours. HbA1C: No results for input(s): HGBA1C in the last 72 hours. CBG: No results for input(s): GLUCAP in the last 168 hours. Lipid Profile: No results for input(s): CHOL, HDL, LDLCALC, TRIG, CHOLHDL, LDLDIRECT in the last 72 hours. Thyroid Function Tests: No results for input(s): TSH, T4TOTAL, FREET4, T3FREE, THYROIDAB in the last 72 hours. Anemia Panel: No results for input(s): VITAMINB12, FOLATE, FERRITIN, TIBC, IRON, RETICCTPCT in the last 72 hours. Sepsis Labs: No results for input(s): PROCALCITON, LATICACIDVEN in the last 168 hours.  No results found for this or any previous visit (from the past 240 hour(s)).   Radiology Studies: Ct Abdomen Pelvis W Contrast  Result Date: 02/16/2018 CLINICAL DATA:  58 year old male with left lower abdominal pain with nausea and fever. Diverticulitis without abscess. Prior appendectomy. Subsequent encounter. EXAM: CT ABDOMEN AND PELVIS WITH CONTRAST TECHNIQUE: Multidetector CT imaging of the abdomen and pelvis was performed using the standard protocol following bolus administration of intravenous contrast. CONTRAST:  30mL ISOVUE-300 IOPAMIDOL (ISOVUE-300) INJECTION 61%, ISOVUE-300 IOPAMIDOL (ISOVUE-300) INJECTION 61% COMPARISON:  02/12/2018 and 01/22/2018 CT. FINDINGS: Lower chest: Lung bases are clear. Heart size within normal limits. Coronary artery calcifications. Hepatobiliary: No focal hepatic lesion. No calcified gallstone or common bile duct stone. Pancreas: No pancreatic mass or inflammation. Spleen: No splenic mass or enlargement. Adrenals/Urinary Tract: No obstructing stone or hydronephrosis. No worrisome renal or adrenal lesion. Stomach/Bowel: Diffuse inflammation of the sigmoid colon. Given the presence of bowel wall thickening and diverticula, this is suspicious  for diverticulitis with rupture and abscess formation. This segment of sigmoid colon can be evaluated after inflammatory processes cleared to exclude underlying mass. Abscess located along the anterior margin of the sigmoid colon has changed configuration slightly now measuring approximately 6.1 x 3.5 x 3.2 cm versus prior 7.1 x 3.6 x 3.6 cm. The inflammatory process extends to the dome of the bladder causing inflammation of the dome of the bladder which appears asymmetrically thickened. Vascular/Lymphatic: Aortic atherosclerotic changes without aneurysm or large vessel occlusion. No adenopathy. Reproductive: Prostate gland top-normal size. Other: Fat containing left inguinal hernia. Injection site anterior abdominal wall greater on right. Decrease amount of free fluid within the pelvis. Musculoskeletal: Degenerative changes lower thoracic and lumbar spine. Hip degenerative changes. IMPRESSION: Findings most consistent with sigmoid diverticulitis with perforation and abscess formation within the adjacent mesentery. Abscess has change configuration slightly now measuring approximately 6.1 x 3.5 x 3.2 cm versus prior 7.1 x 3.6 x 3.6 cm. The inflammatory process extends to the dome of the bladder causing inflammation of the dome of the bladder which appears asymmetrically thickened. This segment of sigmoid colon can be evaluated after acute episode has cleared to exclude underlying mass. Aortic Atherosclerosis (ICD10-I70.0). Coronary artery calcifications. Electronically Signed  By: Lacy Duverney M.D.   On: 02/16/2018 09:42   Scheduled Meds: . busPIRone  10 mg Oral Daily  . heparin injection (subcutaneous)  5,000 Units Subcutaneous Q8H  . sodium chloride flush  3 mL Intravenous Q12H   Continuous Infusions: . sodium chloride 10 mL/hr at 02/13/18 0510  . ampicillin-sulbactam (UNASYN) IV Stopped (02/16/18 0349)     LOS: 4 days   Time spent: 25 minutes  Standley Dakins, MD [facility] Pager  [phone]  If 7PM-7AM, please contact night-coverage [email or web address] Password [email or web address] 02/16/2018, 10:00 AM

## 2018-02-16 NOTE — Progress Notes (Signed)
Patient ID: Brandon Zavala, male   DOB: 1961/06/10, 57 y.o.   MRN: [id number]   CT today: IMPRESSION: Findings most consistent with sigmoid diverticulitis with perforation and abscess formation within the adjacent mesentery. Abscess has change configuration slightly now measuring approximately 6.1 x 3.5 x 3.2 cm versus prior 7.1 x 3.6 x 3.6 cm. The inflammatory process extends to the dome of the bladder causing inflammation of the dome of the bladder which appears asymmetrically thickened. This segment of sigmoid colon can be evaluated after acute episode has cleared to exclude underlying mass.  Dr Grace Isaac has spoken to Dr Lovell Sheehan  Requesting aspiration today  Pt to be at [facility] today by ambulance asap RN arranging ambulance  He will return to [facility] after procedure

## 2018-02-16 NOTE — Consult Note (Signed)
Chief Complaint: Patient was seen in consultation today for intra-abdominal fluid collection  Referring Physician(s): David,Rachal A  Supervising Physician: Simonne Come  Patient Status: [facility] - In-pt  History of Present Illness: Brandon Zavala is a 57 y.o. male with past medical history of diverticulitis who presented to [facility] with abdominal pain.  CT Abdomen Pelvis shows recurrent diverticulitis with associated intra-abdominal fluid collection.  Patient had similar episode in April which resolved with conservative management and antibiotics.  Since admission, he has continued to improve- abdominal pain improved, afebrile, WBC down to 9.6, however repeat imaging continues to show the presence of am abscess.  IR consulted for aspiration and drainage at the request of Dr. Lovell Sheehan.  Case reviewed by Dr. Grace Isaac who approves patient for aspiration attempt.   He has been NPO.  His blood thinners have been held.   Past Medical History:  Diagnosis Date  . Diverticulitis   . Elevated bilirubin     Past Surgical History:  Procedure Laterality Date  . APPENDECTOMY      Allergies: Patient has no known allergies.  Medications: Prior to Admission medications   Medication Sig Start Date End Date Taking? Authorizing Provider  acetaminophen (TYLENOL) 325 MG tablet Take 2 tablets (650 mg total) by mouth every 6 (six) hours as needed for mild pain (or Fever >/= 101). 01/24/18   Esperanza Sheets, MD  ALPRAZolam Prudy Feeler) 1 MG tablet Take 1 mg by mouth at bedtime as needed for anxiety.    [provider]  busPIRone (BUSPAR) 10 MG tablet Take 10 mg by mouth daily.    [provider]  ibuprofen (ADVIL,MOTRIN) 200 MG tablet Take 400 mg by mouth every 6 (six) hours as needed.    [provider]     Family History  Problem Relation Age of Onset  . Hypertension Mother   . COPD Sister   . Hypertension Sister     Social History   Socioeconomic History  . Marital status:  Single    Spouse name: Not on file  . Number of children: Not on file  . Years of education: Not on file  . Highest education level: Not on file  Occupational History  . Not on file  Social Needs  . Financial resource strain: Not on file  . Food insecurity:    Worry: Not on file    Inability: Not on file  . Transportation needs:    Medical: Not on file    Non-medical: Not on file  Tobacco Use  . Smoking status: Former Games developer  . Smokeless tobacco: Never Used  Substance and Sexual Activity  . Alcohol use: Yes    Frequency: Never  . Drug use: Never  . Sexual activity: Yes  Lifestyle  . Physical activity:    Days per week: Not on file    Minutes per session: Not on file  . Stress: Not on file  Relationships  . Social connections:    Talks on phone: Not on file    Gets together: Not on file    Attends religious service: Not on file    Active member of club or organization: Not on file    Attends meetings of clubs or organizations: Not on file    Relationship status: Not on file  Other Topics Concern  . Not on file  Social History Narrative  . Not on file     Review of Systems: A 12 point ROS discussed and pertinent positives are indicated  in the HPI above.  All other systems are negative.  Review of Systems  Constitutional: Negative for fatigue and fever.  Respiratory: Negative for cough and shortness of breath.   Cardiovascular: Negative for chest pain.  Gastrointestinal: Positive for abdominal pain, diarrhea and nausea.  Psychiatric/Behavioral: Negative for behavioral problems and confusion.    Vital Signs: There were no vitals taken for this visit.  Physical Exam  Constitutional: He is oriented to person, place, and time. He appears well-developed.  Cardiovascular: Normal rate, regular rhythm and normal heart sounds.  Pulmonary/Chest: Effort normal and breath sounds normal. No respiratory distress.  Abdominal: Soft. Bowel sounds are normal. He exhibits no  distension. There is tenderness (LLQ, mild to palpation). There is no guarding.  Neurological: He is alert and oriented to person, place, and time.  Skin: Skin is warm and dry.  Psychiatric: He has a normal mood and affect. His behavior is normal. Judgment and thought content normal.  Nursing note and vitals reviewed.    MD Evaluation Airway: WNL Heart: WNL Abdomen: WNL Chest/ Lungs: WNL ASA  Classification: 3 Mallampati/Airway Score: One   Imaging: Ct Abdomen Pelvis W Contrast  Result Date: 02/16/2018 CLINICAL DATA:  57 year old male with left lower abdominal pain with nausea and fever. Diverticulitis without abscess. Prior appendectomy. Subsequent encounter. EXAM: CT ABDOMEN AND PELVIS WITH CONTRAST TECHNIQUE: Multidetector CT imaging of the abdomen and pelvis was performed using the standard protocol following bolus administration of intravenous contrast. CONTRAST:  30mL ISOVUE-300 IOPAMIDOL (ISOVUE-300) INJECTION 61%, ISOVUE-300 IOPAMIDOL (ISOVUE-300) INJECTION 61% COMPARISON:  02/12/2018 and 01/22/2018 CT. FINDINGS: Lower chest: Lung bases are clear. Heart size within normal limits. Coronary artery calcifications. Hepatobiliary: No focal hepatic lesion. No calcified gallstone or common bile duct stone. Pancreas: No pancreatic mass or inflammation. Spleen: No splenic mass or enlargement. Adrenals/Urinary Tract: No obstructing stone or hydronephrosis. No worrisome renal or adrenal lesion. Stomach/Bowel: Diffuse inflammation of the sigmoid colon. Given the presence of bowel wall thickening and diverticula, this is suspicious for diverticulitis with rupture and abscess formation. This segment of sigmoid colon can be evaluated after inflammatory processes cleared to exclude underlying mass. Abscess located along the anterior margin of the sigmoid colon has changed configuration slightly now measuring approximately 6.1 x 3.5 x 3.2 cm versus prior 7.1 x 3.6 x 3.6 cm. The inflammatory process  extends to the dome of the bladder causing inflammation of the dome of the bladder which appears asymmetrically thickened. Vascular/Lymphatic: Aortic atherosclerotic changes without aneurysm or large vessel occlusion. No adenopathy. Reproductive: Prostate gland top-normal size. Other: Fat containing left inguinal hernia. Injection site anterior abdominal wall greater on right. Decrease amount of free fluid within the pelvis. Musculoskeletal: Degenerative changes lower thoracic and lumbar spine. Hip degenerative changes. IMPRESSION: Findings most consistent with sigmoid diverticulitis with perforation and abscess formation within the adjacent mesentery. Abscess has change configuration slightly now measuring approximately 6.1 x 3.5 x 3.2 cm versus prior 7.1 x 3.6 x 3.6 cm. The inflammatory process extends to the dome of the bladder causing inflammation of the dome of the bladder which appears asymmetrically thickened. This segment of sigmoid colon can be evaluated after acute episode has cleared to exclude underlying mass. Aortic Atherosclerosis (ICD10-I70.0). Coronary artery calcifications. Electronically Signed   By: Lacy Duverney M.D.   On: 02/16/2018 09:42   Ct Abdomen Pelvis W Contrast  Result Date: 02/12/2018 CLINICAL DATA:  Low right pelvic pain, possible diverticulitis EXAM: CT ABDOMEN AND PELVIS WITH CONTRAST TECHNIQUE: Multidetector CT imaging of  the abdomen and pelvis was performed using the standard protocol following bolus administration of intravenous contrast. CONTRAST:  ISOVUE-300 IOPAMIDOL (ISOVUE-300) INJECTION 61% COMPARISON:  CT abdomen pelvis of 4 levin 2019 FINDINGS: Lower chest: The lung bases are clear. The heart appears to be within normal limits in size. Coronary artery calcifications are noted in the distribution of of the left anterior descending and right coronary arteries. Hepatobiliary: The liver enhances with no focal abnormality and no ductal dilatation is seen. No calcified  gallstones are noted. Pancreas: The pancreas is normal in size in the pancreatic duct is not dilated. The peripancreatic fat planes are well preserved. Spleen: The spleen is unremarkable. Adrenals/Urinary Tract: The adrenal glands appear normal. The kidneys enhance with no calculus or mass. No hydronephrosis is seen on delayed images. The ureters appear normal in caliber. The urinary bladder is moderately distended with no abnormality noted. Stomach/Bowel: The stomach is decompressed and cannot be well evaluated. No small bowel abnormality is seen. However, there is acute diverticulitis of the sigmoid colon anteriorly and to the left of midline. There is evidence of perforation and pericolonic abscess formation. The largest of the abscesses measures 2.7 x 4.3 cm on image 73 series 2. And adjacent abscess measures 2.2 x 2.9 cm. There is considerable inflammatory reaction with strandiness of the pericolonic fat planes and thickening of the mucosa of the sigmoid colon. The more proximal colon is unremarkable. The terminal ileum appears normal. The appendix has previously been resected by history. Vascular/Lymphatic: The abdominal aorta is normal in caliber with moderate abdominal aortic atherosclerosis noted. No adenopathy is seen. Reproductive: The prostate is normal in size. Other: A tiny amount of fluid layers within the pelvis and within the right lower quadrant presumably due to the inflammatory response to the diverticulitis and diverticular abscesses. Musculoskeletal: The lumbar vertebrae are normal alignment with diffuse degenerative disc disease most marked at L5-S1. The SI joints are well corticated IMPRESSION: 1. Complicated acute diverticulitis of the sigmoid colon with mucosal edema, pericolonic strandiness, as well as evidence of perforation and pericolonic abscess formation as described above. 2. Coronary artery calcifications. 3. Moderate abdominal aortic atherosclerosis. 4. Tiny amount of fluid layers  within the pelvis and right lower quadrant presumably due to the acute diverticulitis. Electronically Signed   By: Dwyane Dee M.D.   On: 02/12/2018 17:06   Ct Abdomen Pelvis W Contrast  Result Date: 01/22/2018 CLINICAL DATA:  Mid central abdominal pain starting yesterday. Diverticulitis suspected EXAM: CT ABDOMEN AND PELVIS WITH CONTRAST TECHNIQUE: Multidetector CT imaging of the abdomen and pelvis was performed using the standard protocol following bolus administration of intravenous contrast. CONTRAST:  ISOVUE-300 IOPAMIDOL (ISOVUE-300) INJECTION 61% COMPARISON:  None. FINDINGS: Lower chest: Normal heart size without pericardial effusion. Clear lung bases. Hepatobiliary: No focal liver abnormality is seen. No gallstones, gallbladder wall thickening, or biliary dilatation. Pancreas: Unremarkable. No pancreatic ductal dilatation or surrounding inflammatory changes. Spleen: Normal in size without focal abnormality. Adrenals/Urinary Tract: Adrenal glands are unremarkable. Kidneys are normal, without renal calculi, focal lesion, or hydronephrosis. Bladder is unremarkable. Stomach/Bowel: Decompressed stomach with normal small bowel rotation. Mild sympathetic ileus of jejunal loops adjacent to pericolonic inflammation in the left lower quadrant and pelvis secondary to acute diverticulitis. Tiny contained extraluminal gas is seen within the mesenteric fat ventral to the proximal sigmoid. No abscess. 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 No lymphadenopathy. Reproductive: Top-normal size prostate. Unremarkable seminal vesicles. Other: No free fluid. Musculoskeletal: Multilevel degenerative disc disease from L2 through S1.  No acute osseous abnormality. No pars defects or listhesis. IMPRESSION: Acute sigmoid diverticulitis with moderate degree of pericolonic inflammation and contained tiny foci of extraluminal gas from presumed perforated diverticulum, series 2/71. No abscess or  bowel obstruction. Sympathetic distention and ileus of adjacent jejunal loops from the pericolonic inflammation. These results were called by telephone at the time of interpretation on 01/22/2018 at 9:58 pm to Dr. Benjiman Core , who verbally acknowledged these results. Electronically Signed   By: Tollie Eth M.D.   On: 01/22/2018 21:58    Labs:  CBC: Recent Labs    01/24/18 0622 02/12/18 1434 02/13/18 0452 02/14/18 0607  WBC 9.1 13.6* 14.2* 9.6  HGB 12.0* 14.0 12.5* 13.0  HCT 39.2 43.1 39.5 42.1  PLT 246 358 345 347    COAGS: Recent Labs    02/12/18 1756  INR 1.03    BMP: Recent Labs    01/24/18 0622 02/12/18 1434 02/13/18 0452 02/14/18 0607  NA 138 136 136 138  K 3.5 4.0 4.3 4.7  CL 103 98* 99* 98*  CO2 GLUCOSE 100* 102* 107* 105*  BUN CALCIUM 8.5* 9.5 8.7* 9.1  CREATININE 0.86 0.91 0.94 0.98  GFRNONAA >60 >60 >60 >60  GFRAA >60 >60 >60 >60    LIVER FUNCTION TESTS: Recent Labs    01/22/18 1911 01/24/18 0622 02/12/18 1812 02/13/18 0452  BILITOT 1.8* 1.2 1.4* 1.6*  AST 17 13* 17 13*  ALT 19 14* 15* 13*  ALKPHOS 66 56 73 64  PROT 7.7 6.4* 8.1 6.6  ALBUMIN 4.3 3.2* 4.1 3.2*    TUMOR MARKERS: No results for input(s): AFPTM, CEA, CA199, CHROMGRNA in the last 8760 hours.  Assessment and Plan: Diverticulitis, suspected abscess Patient with recurrent of diverticulitis.  CT Abdomen Pelvis shows:  Findings most consistent with sigmoid diverticulitis with perforation and abscess formation within the adjacent mesentery. Abscess has change configuration slightly now measuring approximately 6.1 x 3.5 x 3.2 cm versus prior 7.1 x 3.6 x 3.6 cm. The inflammatory process extends to the dome of the bladder causing inflammation of the dome of the bladder which appears asymmetrically thickened. This segment of sigmoid colon can be evaluated after acute episode has cleared to exclude underlying mass.  IR consulted for aspiration and  drainage of intra-abdominal fluid collection.  Case reviewed and approved by Dr. Grace Isaac.  Patient to arrive from [facility] and will return post-procedure.  He has been NPO and his blood thinners have been held.   Risks and benefits discussed with the patient including bleeding, infection, damage to adjacent structures, bowel perforation/fistula connection, and sepsis.  All of the patient's questions were answered, patient is agreeable to proceed. Consent signed and in chart.  Thank you for this interesting consult.  I greatly enjoyed meeting Brandon Zavala and look forward to participating in their care.  A copy of this report was sent to the requesting provider on this date.  Electronically Signed: Hoyt Koch, PA 02/16/2018, 1:07 PM   I spent a total of 40 Minutes    in face to face in clinical consultation, greater than 50% of which was counseling/coordinating care for intra-abdominal fluid collection.

## 2018-02-16 NOTE — Progress Notes (Signed)
[facility] called, transportation set up for round trip to radiology department at  [facility] for IR drainage and will come back to [facility] post op per Dr. Lovell Sheehan.

## 2018-02-16 NOTE — Progress Notes (Signed)
[facility] Progress Note     Subjective: No major issues. Pain minimal. Diarrhea. CT with more organized collection, IR willing to try and aspirate.   Objective: Vital signs in last 24 hours: Temp:  [98 F (36.7 C)-98.3 F (36.8 C)] 98 F (36.7 C) (05/06 0552) Pulse Rate:  [66-80] 66 (05/06 0552) Resp:  [16] 16 (05/06 0552) BP: (149-159)/(93-96) 159/96 (05/06 0552) SpO2:  [97 %-99 %] 98 % (05/06 0552) Weight:  [188 lb 12.8 oz (85.6 kg)] 188 lb 12.8 oz (85.6 kg) (05/06 0551) Last BM Date: 02/15/18  Intake/Output from previous day: 05/05 0701 - 05/06 0700 In: 240 [P.O.:240] Out: -  Intake/Output this shift: Total I/O In: 0  Out: 300 [Urine:300]  General appearance: alert, cooperative and no distress Resp: normal work breathing GI: soft, nondistended, minimally tender LLQ Extremities: extremities normal, atraumatic, no cyanosis or edema  Lab Results:  Recent Labs    02/14/18 0607  WBC 9.6  HGB 13.0  HCT 42.1  PLT 347   BMET Recent Labs    02/14/18 0607  NA 138  K 4.7  CL 98*  CO2 28  GLUCOSE 105*  BUN 9  CREATININE 0.98  CALCIUM 9.1   Personally reviewed, slightly smaller, possibly more organized collection  Studies/Results: Ct Abdomen Pelvis W Contrast  Result Date: 02/16/2018 CLINICAL DATA:  57 year old male with left lower abdominal pain with nausea and fever. Diverticulitis without abscess. Prior appendectomy. Subsequent encounter. EXAM: CT ABDOMEN AND PELVIS WITH CONTRAST TECHNIQUE: Multidetector CT imaging of the abdomen and pelvis was performed using the standard protocol following bolus administration of intravenous contrast. CONTRAST:  30mL ISOVUE-300 IOPAMIDOL (ISOVUE-300) INJECTION 61%, ISOVUE-300 IOPAMIDOL (ISOVUE-300) INJECTION 61% COMPARISON:  02/12/2018 and 01/22/2018 CT. FINDINGS: Lower chest: Lung bases are clear. Heart size within normal limits. Coronary artery calcifications. Hepatobiliary: No focal hepatic lesion. No  calcified gallstone or common bile duct stone. Pancreas: No pancreatic mass or inflammation. Spleen: No splenic mass or enlargement. Adrenals/Urinary Tract: No obstructing stone or hydronephrosis. No worrisome renal or adrenal lesion. Stomach/Bowel: Diffuse inflammation of the sigmoid colon. Given the presence of bowel wall thickening and diverticula, this is suspicious for diverticulitis with rupture and abscess formation. This segment of sigmoid colon can be evaluated after inflammatory processes cleared to exclude underlying mass. Abscess located along the anterior margin of the sigmoid colon has changed configuration slightly now measuring approximately 6.1 x 3.5 x 3.2 cm versus prior 7.1 x 3.6 x 3.6 cm. The inflammatory process extends to the dome of the bladder causing inflammation of the dome of the bladder which appears asymmetrically thickened. Vascular/Lymphatic: Aortic atherosclerotic changes without aneurysm or large vessel occlusion. No adenopathy. Reproductive: Prostate gland top-normal size. Other: Fat containing left inguinal hernia. Injection site anterior abdominal wall greater on right. Decrease amount of free fluid within the pelvis. Musculoskeletal: Degenerative changes lower thoracic and lumbar spine. Hip degenerative changes. IMPRESSION: Findings most consistent with sigmoid diverticulitis with perforation and abscess formation within the adjacent mesentery. Abscess has change configuration slightly now measuring approximately 6.1 x 3.5 x 3.2 cm versus prior 7.1 x 3.6 x 3.6 cm. The inflammatory process extends to the dome of the bladder causing inflammation of the dome of the bladder which appears asymmetrically thickened. This segment of sigmoid colon can be evaluated after acute episode has cleared to exclude underlying mass. Aortic Atherosclerosis (ICD10-I70.0). Coronary artery calcifications. Electronically Signed   By: Lacy Duverney M.D.   On: 02/16/2018 09:42     Anti-infectives: Anti-infectives (  From admission, onward)   Start     Dose/Rate Route Frequency Ordered Stop   02/12/18 2000  Ampicillin-Sulbactam (UNASYN) 3 g in sodium chloride 0.9 % 100 mL IVPB     3 g 200 mL/hr over 30 Minutes Intravenous Every 8 hours 02/12/18 1934     02/12/18 1745  ciprofloxacin (CIPRO) IVPB 400 mg     400 mg 200 mL/hr over 60 Minutes Intravenous  Once 02/12/18 1735 02/12/18 1842   02/12/18 1745  metroNIDAZOLE (FLAGYL) IVPB 500 mg  Status:  Discontinued     500 mg 100 mL/hr over 60 Minutes Intravenous  Once 02/12/18 1735 02/12/18 1815      Assessment/Plan: Brandon Zavala is a 57 yo with complicated diverticulitis now requiring IR drainage.  Will need colonoscopy prior to our surgical intervention. IR to drain today. Can have diet following IR. Continue antibiotics.   Algis Greenhouse, MD [facility] [address] [place], [place] [address] [phone] (office)    LOS: 4 days    Brandon Zavala 02/16/2018

## 2018-02-16 NOTE — Procedures (Signed)
Pre procedural Dx: Diverticular Abscess Post procedural Dx: Same  Technically successful CT guided placed of a 10 Fr drainage catheter placement into the left lower abdominal/pelvic diverticular abscess yielding 15 cc or purulent material.    All aspirated samples sent to the laboratory for analysis.    EBL: Minimal  Complications: None immediate  [name], MD Pager #: [phone]

## 2018-02-17 LAB — CBC WITH DIFFERENTIAL/PLATELET
Basophils Absolute: 0 10*3/uL (ref 0.0–0.1)
Basophils Relative: 0 %
Eosinophils Absolute: 0.2 10*3/uL (ref 0.0–0.7)
Eosinophils Relative: 3 %
HEMATOCRIT: 41.6 % (ref 39.0–52.0)
HEMOGLOBIN: 13.1 g/dL (ref 13.0–17.0)
LYMPHS ABS: 2.1 10*3/uL (ref 0.7–4.0)
Lymphocytes Relative: 24 %
MCH: 29.6 pg (ref 26.0–34.0)
MCHC: 31.5 g/dL (ref 30.0–36.0)
MCV: 93.9 fL (ref 78.0–100.0)
MONO ABS: 0.7 10*3/uL (ref 0.1–1.0)
MONOS PCT: 9 %
NEUTROS ABS: 5.5 10*3/uL (ref 1.7–7.7)
NEUTROS PCT: 64 %
Platelets: 375 10*3/uL (ref 150–400)
RBC: 4.43 MIL/uL (ref 4.22–5.81)
RDW: 12.6 % (ref 11.5–15.5)
WBC: 8.5 10*3/uL (ref 4.0–10.5)

## 2018-02-17 LAB — COMPREHENSIVE METABOLIC PANEL
ALK PHOS: 68 U/L (ref 38–126)
ALT: 16 U/L — AB (ref 17–63)
AST: 15 U/L (ref 15–41)
Albumin: 3.2 g/dL — ABNORMAL LOW (ref 3.5–5.0)
Anion gap: 9 (ref 5–15)
BILIRUBIN TOTAL: 0.7 mg/dL (ref 0.3–1.2)
BUN: 14 mg/dL (ref 6–20)
CALCIUM: 9.2 mg/dL (ref 8.9–10.3)
CO2: 30 mmol/L (ref 22–32)
CREATININE: 1.03 mg/dL (ref 0.61–1.24)
Chloride: 101 mmol/L (ref 101–111)
Glucose, Bld: 93 mg/dL (ref 65–99)
Potassium: 4.3 mmol/L (ref 3.5–5.1)
Sodium: 140 mmol/L (ref 135–145)
TOTAL PROTEIN: 6.7 g/dL (ref 6.5–8.1)

## 2018-02-17 LAB — MAGNESIUM: Magnesium: 2 mg/dL (ref 1.7–2.4)

## 2018-02-17 MED ORDER — DOCUSATE SODIUM 100 MG PO CAPS: 200.0000 mg | ORAL_CAPSULE | Freq: Two times a day (BID) | ORAL | 0 refills | Status: AC

## 2018-02-17 MED ORDER — AMOXICILLIN-POT CLAVULANATE 875-125 MG PO TABS: 1.0000 | ORAL_TABLET | Freq: Two times a day (BID) | ORAL | 0 refills | Status: AC

## 2018-02-17 MED ORDER — OXYCODONE HCL 5 MG PO TABS
5.0000 mg | ORAL_TABLET | ORAL | 0 refills | Status: AC | PRN
Start: 2018-02-17 — End: ?

## 2018-02-17 MED ORDER — OXYCODONE HCL 5 MG PO TABS: 5.0000 mg | ORAL_TABLET | ORAL | Status: DC | PRN

## 2018-02-17 NOTE — Care Management Note (Signed)
Case Management Note  Patient Details  Name: Brandon Zavala MRN: [id number] Date of Birth: [birth year]  If discussed at Long Length of Stay Meetings, dates discussed:  02/17/18    Malcolm Metro, RN 02/17/2018, 12:04 PM

## 2018-02-17 NOTE — Progress Notes (Signed)
Patient ID: Brandon Zavala, male   DOB: October 14, 1961, 57 y.o.   MRN: [id number]   5/6: Successful CT guided placement of a 10 French all purpose drain catheter into the diverticular abscess within the left lower abdomen/pelvis with aspiration of 15 mL of purulent fluid.  Call to RN Reports flushing easily OP 20 cc yesterday  Wbc down afeb  Will see pt in OP IR Clinic 7-10 days Drain injection and CT He will hear from scheduler for time and date  Please flush daily 5 cc sterile saline Record OP

## 2018-02-17 NOTE — Care Management (Signed)
Patient Information   Patient Name Brandon Zavala, Brandon Zavala ([id number]) Sex Male DOB 1961-06-17  Room Bed  A320 A320-01  Patient Demographics   Address [address] [place] [address] Phone [phone] (Home) [phone] (Mobile) E-mail Address [email or web address]  Patient Ethnicity & Race   Ethnic Group Patient Race  Not Hispanic or Latino White or Caucasian  Emergency Contact(s)   Name Relation Home Work Mobile  [name],[name] Mother [phone]    Documents on File    Status Date Received Description  Documents for the Patient  [place] HIPAA NOTICE OF PRIVACY - Scanned Not Received    [facility] E-Signature HIPAA Notice of Privacy Signed 01/22/18   Driver's License Re[place] 01/22/18 [place]  Insurance Card Received 01/22/18 01/22/2018  Advance Directives/Living Will/HCPOA/POA Not Received    Other Photo ID Not Received    Insurance Card   01/22/2018  [name]     Release of Information Not Received    Advanced Beneficiary Notice (ABN) Not Received    E-Signature AOB Spanish Not Received    Patient Photo   Photo of Patient  Documents for the Encounter  AOB (Assignment of Insurance Benefits) Not Received    E-signature AOB Signed 02/12/18   MEDICARE RIGHTS Not Received    E-signature Medicare Rights     ED Patient Billing Extract   ED PB Billing Extract  EMS Run Sheet Received 02/17/18   Consent Form     Admission Information   Attending Provider Admitting Provider Admission Type Admission Date/Time  [name], MD [name], MD Emergency 02/12/18 1403  Discharge Date Hospital Service Auth/Cert Status Service Area   Internal Medicine Incomplete [facility]  Unit Room/Bed Admission Status   [facility]-DEPT 300 A320/A320-01 Admission (Confirmed)   Admission   Complaint  abd [facility] Account   Name Acct ID Class Status Primary Coverage  [name], [name] [id number] Inpatient Open MEDICAID  OUT [place]      Guarantor Account (for Hospital Account [id number])   Name Relation to Pt Service Area Active? Acct Type  [name] Self [facility] Yes Personal/Family  Address Phone    [address] [place], [place] [address] [phone](H)        Coverage Information (for Hospital Account [id number])   F/O Payor/Plan Precert #  MEDICA[place] OUT OF [place]   Subscriber Subscriber #  [name], [name] [id number]  Address Phone  [address] [address] [place], [place] [address] [phone]

## 2018-02-17 NOTE — Progress Notes (Signed)
Pharmacy Antibiotic Note  Brandon Zavala is a 57 y.o. male admitted on 02/12/2018 with intra-abdominal infection.  Pharmacy has been consulted for unasyn dosing. Patient improving. afebrile, WBC normalized. Plan: Continue Unasyn 3gm IV q8h F/U cxs and clinical progress and deescalation Monitor V/S, labs   Height: 6' (182.9 cm) Weight: 187 lb 9.6 oz (85.1 kg) IBW/kg (Calculated) : 77.6  Temp (24hrs), Avg:98.4 F (36.9 C), Min:98.4 F (36.9 C), Max:98.4 F (36.9 C)  Recent Labs  Lab 02/12/18 1434 02/13/18 0452 02/14/18 0607 02/17/18 0531  WBC 13.6* 14.2* 9.6 8.5  CREATININE 0.91 0.94 0.98 1.03    Estimated Creatinine Clearance: 88.9 mL/min (by C-G formula based on SCr of 1.03 mg/dL).    No Known Allergies  Antimicrobials this admission: Unasyn 5/2 >>   Dose adjustments this admission: N/A  Microbiology results: No cxs  Thank you for allowing pharmacy to be a part of this patient's care.  Valrie Hart, PharmD Clinical Pharmacist Pager:  [phone] 02/17/2018  02/17/2018 10:45 AM

## 2018-02-17 NOTE — Progress Notes (Signed)
Brandon Zavala discharged Home per MD order.  Discharge instructions reviewed and discussed with the patient, all questions and concerns answered. Copy of instructions and scripts given to patient. Taught pt how to flush LLQ drain with 58mL's of NS Q8H. Sent patient home with supplies to maintain drain until home health is initiated.  Allergies as of 02/17/2018   No Known Allergies     Medication List    STOP taking these medications   acetaminophen 325 MG tablet Commonly known as:  TYLENOL     TAKE these medications   amoxicillin-clavulanate 875-125 MG tablet Commonly known as:  AUGMENTIN Take 1 tablet by mouth 2 (two) times daily for 10 days. Start taking on:  02/18/2018   busPIRone 10 MG tablet Commonly known as:  BUSPAR Take 10 mg by mouth daily.   docusate sodium 100 MG capsule Commonly known as:  COLACE Take 2 capsules (200 mg total) by mouth 2 (two) times daily for 10 days.   ibuprofen 200 MG tablet Commonly known as:  ADVIL,MOTRIN Take 400 mg by mouth every 6 (six) hours as needed.   oxyCODONE 5 MG immediate release tablet Commonly known as:  Oxy IR/ROXICODONE Take 1 tablet (5 mg total) by mouth every 4 (four) hours as needed for severe pain or breakthrough pain.   XANAX 1 MG tablet Generic drug:  ALPRAZolam Take 1 mg by mouth at bedtime as needed for anxiety.       Patients skin is clean, dry and intact, no evidence of skin break down. IV site discontinued and catheter remains intact. Site without signs and symptoms of complications. Dressing and pressure applied.  Patient escorted to the elevator,  no distress noted upon discharge.  Brandon Zavala Brandon Zavala 02/17/2018 4:30 PM

## 2018-02-17 NOTE — Progress Notes (Signed)
PROGRESS NOTE    Brandon Zavala  ZOX:[id number] DOB: September 03, 1961 DOA: 02/12/2018 PCP: Patient, No Pcp Per   Brief Narrative:  57 year old male with a recent hospitalization for acute diverticulitis and treated with Levaquin and Flagyl, admitted to the hospital with left lower quadrant abdominal pain.  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  On intravenous antibiotics.  General surgery following.  He will need continued IV antibiotics for the next 2 to 3 days followed by repeat CT scan to evaluate if abscess has adequately developed for drain placement per interventional radiology.  Assessment & Plan:   Principal Problem:   Diverticulitis of intestine with abscess without bleeding Active Problems:   Abdominal pain, acute, left lower quadrant   Elevated bilirubin  1. Sigmoid diverticulitis with perforation and abscess.  Pt is POD 1 s/p drainage placement,  Drain has been emptied at least 3 times already.  Pt says he is feeling better and eating now.  Bilirubin normal now. Tolerating soft diet.  2. Generalized Anxiety Disorder.  Continue BuSpar and as needed Xanax. 3. Leukocytosis - WBC has come back down to normal range.    DVT prophylaxis: Heparin Code Status: Full code Family Communication: No family present Disposition Plan: pending surgical consult recommendations    Consultants:   General surgery  I.R. (radiology)  Procedures:     Antimicrobials:   Unasyn 5/2 >   Subjective: Pt eating breakfast, says that he is sore at area of drain insertion but overall doing well.   Objective: Vitals:   02/16/18 2017 02/16/18 2024 02/17/18 0500 02/17/18 0649  BP: (!) 144/85   129/83  Pulse: 88   84  Resp: 17   18  Temp: 98.4 F (36.9 C)   98.4 F (36.9 C)  TempSrc: Oral   Oral  SpO2: 98% 99%  98%  Weight:   85.1 kg (187 lb 9.6 oz)   Height:        Intake/Output Summary (Last 24 hours) at 02/17/2018 1024 Last data filed at 02/17/2018 0900 Gross per  24 hour  Intake 730 ml  Output 1070 ml  Net -340 ml   Filed Weights   02/15/18 0541 02/16/18 0551 02/17/18 0500  Weight: 86 kg (189 lb 9.6 oz) 85.6 kg (188 lb 12.8 oz) 85.1 kg (187 lb 9.6 oz)    Examination:  General exam: Alert, awake, oriented x 3 Respiratory system: Clear to auscultation. Respiratory effort normal. Cardiovascular system: normal s1.s2 sounds.  No murmurs, rubs, gallops. Gastrointestinal system: Abdomen is nondistended, soft and mild tenderness in the left lower quadrant. No organomegaly or masses felt. Normal bowel sounds heard. Central nervous system: Alert and oriented. No focal neurological deficits. Extremities: No C/C/E, +pedal pulses Skin: No rashes, lesions or ulcers Psychiatry: Judgement and insight appear normal. Mood & affect appropriate.   Data Reviewed: I have personally reviewed following labs and imaging studies  CBC: Recent Labs  Lab 02/12/18 1434 02/13/18 0452 02/14/18 0607 02/17/18 0531  WBC 13.6* 14.2* 9.6 8.5  NEUTROABS 11.3*  --   --  5.5  HGB 14.0 12.5* 13.0 13.1  HCT 43.1 39.5 42.1 41.6  MCV 93.1 94.5 95.2 93.9  PLT 358 345 347 375   Basic Metabolic Panel: Recent Labs  Lab 02/12/18 1434 02/13/18 0452 02/14/18 0607 02/17/18 0531  NA 136 136 138 140  K 4.0 4.3 4.7 4.3  CL 98* 99* 98* 101  CO2 GLUCOSE 102* 107* 105* 93  BUN 10  CREATININE 0.91 0.94 0.98 1.03  CALCIUM 9.5 8.7* 9.1 9.2  MG  --   --   --  2.0   GFR: Estimated Creatinine Clearance: 88.9 mL/min (by C-G formula based on SCr of 1.03 mg/dL). Liver Function Tests: Recent Labs  Lab 02/12/18 1812 02/13/18 0452 02/17/18 0531  AST 17 13* 15  ALT 15* 13* 16*  ALKPHOS 73 64 68  BILITOT 1.4* 1.6* 0.7  PROT 8.1 6.6 6.7  ALBUMIN 4.1 3.2* 3.2*   No results for input(s): LIPASE, AMYLASE in the last 168 hours. No results for input(s): AMMONIA in the last 168 hours. Coagulation Profile: Recent Labs  Lab 02/12/18 1756  INR 1.03   Cardiac  Enzymes: No results for input(s): CKTOTAL, CKMB, CKMBINDEX, TROPONINI in the last 168 hours. BNP (last 3 results) No results for input(s): PROBNP in the last 8760 hours. HbA1C: No results for input(s): HGBA1C in the last 72 hours. CBG: No results for input(s): GLUCAP in the last 168 hours. Lipid Profile: No results for input(s): CHOL, HDL, LDLCALC, TRIG, CHOLHDL, LDLDIRECT in the last 72 hours. Thyroid Function Tests: No results for input(s): TSH, T4TOTAL, FREET4, T3FREE, THYROIDAB in the last 72 hours. Anemia Panel: No results for input(s): VITAMINB12, FOLATE, FERRITIN, TIBC, IRON, RETICCTPCT in the last 72 hours. Sepsis Labs: No results for input(s): PROCALCITON, LATICACIDVEN in the last 168 hours.  Recent Results (from the past 240 hour(s))  Aerobic/Anaerobic Culture (surgical/deep wound)     Status: None (Preliminary result)   Collection Time: 02/16/18  3:10 PM  Result Value Ref Range Status   Specimen Description ABSCESS DRAIN  Final   Special Requests NONE  Final   Gram Stain   Final    ABUNDANT WBC PRESENT,BOTH PMN AND MONONUCLEAR FEW GRAM POSITIVE COCCI Performed at [facility], [address] [place], [place] [address]    Culture PENDING  Incomplete   Report Status PENDING  Incomplete     Radiology Studies: Ct Abdomen Pelvis W Contrast  Result Date: 02/16/2018 CLINICAL DATA:  57 year old male with left lower abdominal pain with nausea and fever. Diverticulitis without abscess. Prior appendectomy. Subsequent encounter. EXAM: CT ABDOMEN AND PELVIS WITH CONTRAST TECHNIQUE: Multidetector CT imaging of the abdomen and pelvis was performed using the standard protocol following bolus administration of intravenous contrast. CONTRAST:  30mL ISOVUE-300 IOPAMIDOL (ISOVUE-300) INJECTION 61%, ISOVUE-300 IOPAMIDOL (ISOVUE-300) INJECTION 61% COMPARISON:  02/12/2018 and 01/22/2018 CT. FINDINGS: Lower chest: Lung bases are clear. Heart size within normal limits. Coronary  artery calcifications. Hepatobiliary: No focal hepatic lesion. No calcified gallstone or common bile duct stone. Pancreas: No pancreatic mass or inflammation. Spleen: No splenic mass or enlargement. Adrenals/Urinary Tract: No obstructing stone or hydronephrosis. No worrisome renal or adrenal lesion. Stomach/Bowel: Diffuse inflammation of the sigmoid colon. Given the presence of bowel wall thickening and diverticula, this is suspicious for diverticulitis with rupture and abscess formation. This segment of sigmoid colon can be evaluated after inflammatory processes cleared to exclude underlying mass. Abscess located along the anterior margin of the sigmoid colon has changed configuration slightly now measuring approximately 6.1 x 3.5 x 3.2 cm versus prior 7.1 x 3.6 x 3.6 cm. The inflammatory process extends to the dome of the bladder causing inflammation of the dome of the bladder which appears asymmetrically thickened. Vascular/Lymphatic: Aortic atherosclerotic changes without aneurysm or large vessel occlusion. No adenopathy. Reproductive: Prostate gland top-normal size. Other: Fat containing left inguinal hernia. Injection site anterior abdominal wall greater on right.  Decrease amount of free fluid within the pelvis. Musculoskeletal: Degenerative changes lower thoracic and lumbar spine. Hip degenerative changes. IMPRESSION: Findings most consistent with sigmoid diverticulitis with perforation and abscess formation within the adjacent mesentery. Abscess has change configuration slightly now measuring approximately 6.1 x 3.5 x 3.2 cm versus prior 7.1 x 3.6 x 3.6 cm. The inflammatory process extends to the dome of the bladder causing inflammation of the dome of the bladder which appears asymmetrically thickened. This segment of sigmoid colon can be evaluated after acute episode has cleared to exclude underlying mass. Aortic Atherosclerosis (ICD10-I70.0). Coronary artery calcifications. Electronically Signed   By:  Lacy Duverney M.D.   On: 02/16/2018 09:42   Ct Image Guided Drainage By Percutaneous Catheter  Result Date: 02/16/2018 INDICATION: Diverticular abscess. Please perform CT-guided drainage catheter placement for infection source control purposes. EXAM: CT IMAGE GUIDED DRAINAGE BY PERCUTANEOUS CATHETER COMPARISON:  CT abdomen and pelvis - 02/16/2018; 02/12/2018; 01/22/2018 MEDICATIONS: The patient is currently admitted to the hospital and receiving intravenous antibiotics. The antibiotics were administered within an appropriate time frame prior to the initiation of the procedure. ANESTHESIA/SEDATION: Moderate (conscious) sedation was employed during this procedure. A total of Versed 1.5 mg and Fentanyl 75 mcg was administered intravenously. Moderate Sedation Time: 16 minutes. The patient's level of consciousness and vital signs were monitored continuously by radiology nursing throughout the procedure under my direct supervision. CONTRAST:  None COMPLICATIONS: None immediate. PROCEDURE: Informed written consent was obtained from the patient after a discussion of the risks, benefits and alternatives to treatment. The patient was placed supine on the CT gantry and a pre procedural CT was performed re-demonstrating the known abscess/fluid collection within the left lower lobe abdomen/pelvis, anterior to the sigmoid colon with dominant collection measuring approximately 3.7 x 2.7 cm (image 13, series 3). The procedure was planned. A timeout was performed prior to the initiation of the procedure. The skin overlying the anterolateral aspect the lower abdomen/pelvis was prepped and draped in the usual sterile fashion. The overlying soft tissues were anesthetized with 1% lidocaine with epinephrine. Appropriate trajectory was planned with the use of a 22 gauge spinal needle. An 18 gauge trocar needle was advanced into the abscess/fluid collection and a short Amplatz super stiff wire was coiled within the collection.  Appropriate positioning was confirmed with a limited CT scan. The tract was serially dilated allowing placement of a 10 [place] all-purpose drainage catheter. Appropriate positioning was confirmed with a limited postprocedural CT scan. Approximately 15 ml of purulent fluid was aspirated. The tube was connected to a drainage bag and sutured in place. A dressing was placed. The patient tolerated the procedure well without immediate post procedural complication. IMPRESSION: Successful CT guided placement of a 10 [place] all purpose drain catheter into the diverticular abscess within the left lower abdomen/pelvis with aspiration of 15 mL of purulent fluid. Samples were sent to the laboratory as requested by the ordering clinical team. Electronically Signed   By: Simonne Come M.D.   On: 02/16/2018 17:10   Scheduled Meds: . busPIRone  10 mg Oral Daily  . heparin injection (subcutaneous)  5,000 Units Subcutaneous Q8H  . sodium chloride flush  3 mL Intravenous Q12H  . sodium chloride flush  5 mL Intracatheter Q8H   Continuous Infusions: . sodium chloride 10 mL/hr at 02/13/18 0510  . ampicillin-sulbactam (UNASYN) IV Stopped (02/17/18 0517)     LOS: 5 days   Time spent: 25 minutes  Standley Dakins, MD [facility] Pager [phone]  If  7PM-7AM, please contact night-coverage [email or web address] Password [email or web address] 02/17/2018, 10:24 AM

## 2018-02-17 NOTE — Discharge Summary (Signed)
Physician Discharge Summary  Brandon Zavala WUJ:[id number] DOB: [birth year] DOA: 02/12/2018  PCP: Patient, No Pcp Per Surgeon: Dr. Henreitta Leber  Admit date: 02/12/2018 Discharge date: 02/17/2018  Admitted From: Home  Disposition: Home  Recommendations for Outpatient Follow-up:  1. Follow up with [facility] as scheduled 2. Follow up with Dr. Henreitta Leber on 02/26/18.   Home Health: RN for assistance with drain care  Discharge Condition: STABLE  CODE STATUS: FULL    Brief Hospitalization Summary: Please see all hospital notes, images, labs for full details of the hospitalization.  Brief Narrative:  57 year old male with a recent hospitalization for acute diverticulitis and treated with Levaquin and Flagyl, admitted to the hospital with left lower quadrant abdominal pain.  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  On intravenous antibiotics.  General surgery following.  He will need continued IV antibiotics for the next 2 to 3 days followed by repeat CT scan to evaluate if abscess has adequately developed for drain placement per interventional radiology.  Assessment & Plan:   Principal Problem:   Diverticulitis of intestine with abscess without bleeding Active Problems:   Abdominal pain, acute, left lower quadrant   Elevated bilirubin  HPI: Brandon Zavala is a 57 y.o. male with no medical history except recent hospitalization for acute diverticulitis for which she was treated with  Levaquin and Flagyl which she completed a week ago Thursday.  Patient reports he never returned to normal.  He constantly was having left lower quadrant abdominal pain it was improved but did not totally resolve.  He reports for about 2 days after completing the antibiotics he had minimal pain.  However the pain started to come back yesterday and was very severe this morning in his left lower quadrant..  Patient also started to spike fevers.  Patient came to emergency department and has been found to  have several abscesses associated with his previous area of diverticulitis and referred for admission for such.  General surgery Dr. Lovell Sheehan has been called who is recommending IR drainage in the morning and subsequent eventual partial colectomy.  Patient has had no other previous episodes of  diverticulitis in the past.  1. Sigmoid diverticulitis with perforation and abscess.  Pt is POD 1 s/p drainage placement,  Drain has been emptied at least 3 times already.  Pt says he is feeling better and eating now.  Bilirubin normal now. Tolerating soft diet. I spoke with Dr. Henreitta Leber, he is ok to discharge home today. Follow up with IR [facility] clinic and follow up with Dr. Henreitta Leber on 5/16.  Discharge on 10 more days of augmentin and stool softener.  Encouraged soft diet.   2. Generalized Anxiety Disorder.  Continue BuSpar and as needed Xanax. 3. Leukocytosis - WBC has come back down to normal range.    DVT prophylaxis: Heparin Code Status: Full code Family Communication: No family present Disposition Plan: Home   Consultants:   General surgery  I.R. (radiology)  Discharge Diagnoses:  Principal Problem:   Diverticulitis of intestine with abscess without bleeding Active Problems:   Abdominal pain, acute, left lower quadrant   Elevated bilirubin  Discharge Instructions: Discharge Instructions    Call MD for:  difficulty breathing, headache or visual disturbances   Complete by:  As directed    Call MD for:  extreme fatigue   Complete by:  As directed    Call MD for:  persistant dizziness or light-headedness   Complete by:  As directed    Call MD  for:  persistant nausea and vomiting   Complete by:  As directed    Call MD for:  redness, tenderness, or signs of infection (pain, swelling, redness, odor or green/yellow discharge around incision site)   Complete by:  As directed    Call MD for:  severe uncontrolled pain   Complete by:  As directed    Diet - low sodium heart healthy   Complete  by:  As directed    Increase activity slowly   Complete by:  As directed    Increase activity slowly   Complete by:  As directed      Allergies as of 02/17/2018   No Known Allergies     Medication List    STOP taking these medications   acetaminophen 325 MG tablet Commonly known as:  TYLENOL     TAKE these medications   amoxicillin-clavulanate 875-125 MG tablet Commonly known as:  AUGMENTIN Take 1 tablet by mouth 2 (two) times daily for 10 days. Start taking on:  02/18/2018   busPIRone 10 MG tablet Commonly known as:  BUSPAR Take 10 mg by mouth daily.   docusate sodium 100 MG capsule Commonly known as:  COLACE Take 2 capsules (200 mg total) by mouth 2 (two) times daily for 10 days.   ibuprofen 200 MG tablet Commonly known as:  ADVIL,MOTRIN Take 400 mg by mouth every 6 (six) hours as needed.   oxyCODONE 5 MG immediate release tablet Commonly known as:  Oxy IR/ROXICODONE Take 1 tablet (5 mg total) by mouth every 4 (four) hours as needed for severe pain or breakthrough pain.   XANAX 1 MG tablet Generic drug:  ALPRAZolam Take 1 mg by mouth at bedtime as needed for anxiety.      Follow-up Information    Lucretia Roers, MD. Schedule an appointment as soon as possible for a visit on 02/26/2018.   Specialty:  General Surgery Why:  Hospital Follow Up  Contact information: [address] [place] [address] [phone]          No Known Allergies Allergies as of 02/17/2018   No Known Allergies     Medication List    STOP taking these medications   acetaminophen 325 MG tablet Commonly known as:  TYLENOL     TAKE these medications   amoxicillin-clavulanate 875-125 MG tablet Commonly known as:  AUGMENTIN Take 1 tablet by mouth 2 (two) times daily for 10 days. Start taking on:  02/18/2018   busPIRone 10 MG tablet Commonly known as:  BUSPAR Take 10 mg by mouth daily.   docusate sodium 100 MG capsule Commonly known as:  COLACE Take 2 capsules  (200 mg total) by mouth 2 (two) times daily for 10 days.   ibuprofen 200 MG tablet Commonly known as:  ADVIL,MOTRIN Take 400 mg by mouth every 6 (six) hours as needed.   oxyCODONE 5 MG immediate release tablet Commonly known as:  Oxy IR/ROXICODONE Take 1 tablet (5 mg total) by mouth every 4 (four) hours as needed for severe pain or breakthrough pain.   XANAX 1 MG tablet Generic drug:  ALPRAZolam Take 1 mg by mouth at bedtime as needed for anxiety.       Procedures/Studies: Ct Abdomen Pelvis W Contrast  Result Date: 02/16/2018 CLINICAL DATA:  57 year old male with left lower abdominal pain with nausea and fever. Diverticulitis without abscess. Prior appendectomy. Subsequent encounter. EXAM: CT ABDOMEN AND PELVIS WITH CONTRAST TECHNIQUE: Multidetector CT imaging of the abdomen and pelvis was  performed using the standard protocol following bolus administration of intravenous contrast. CONTRAST:  30mL ISOVUE-300 IOPAMIDOL (ISOVUE-300) INJECTION 61%, ISOVUE-300 IOPAMIDOL (ISOVUE-300) INJECTION 61% COMPARISON:  02/12/2018 and 01/22/2018 CT. FINDINGS: Lower chest: Lung bases are clear. Heart size within normal limits. Coronary artery calcifications. Hepatobiliary: No focal hepatic lesion. No calcified gallstone or common bile duct stone. Pancreas: No pancreatic mass or inflammation. Spleen: No splenic mass or enlargement. Adrenals/Urinary Tract: No obstructing stone or hydronephrosis. No worrisome renal or adrenal lesion. Stomach/Bowel: Diffuse inflammation of the sigmoid colon. Given the presence of bowel wall thickening and diverticula, this is suspicious for diverticulitis with rupture and abscess formation. This segment of sigmoid colon can be evaluated after inflammatory processes cleared to exclude underlying mass. Abscess located along the anterior margin of the sigmoid colon has changed configuration slightly now measuring approximately 6.1 x 3.5 x 3.2 cm versus prior 7.1 x 3.6 x 3.6 cm.  The inflammatory process extends to the dome of the bladder causing inflammation of the dome of the bladder which appears asymmetrically thickened. Vascular/Lymphatic: Aortic atherosclerotic changes without aneurysm or large vessel occlusion. No adenopathy. Reproductive: Prostate gland top-normal size. Other: Fat containing left inguinal hernia. Injection site anterior abdominal wall greater on right. Decrease amount of free fluid within the pelvis. Musculoskeletal: Degenerative changes lower thoracic and lumbar spine. Hip degenerative changes. IMPRESSION: Findings most consistent with sigmoid diverticulitis with perforation and abscess formation within the adjacent mesentery. Abscess has change configuration slightly now measuring approximately 6.1 x 3.5 x 3.2 cm versus prior 7.1 x 3.6 x 3.6 cm. The inflammatory process extends to the dome of the bladder causing inflammation of the dome of the bladder which appears asymmetrically thickened. This segment of sigmoid colon can be evaluated after acute episode has cleared to exclude underlying mass. Aortic Atherosclerosis (ICD10-I70.0). Coronary artery calcifications. Electronically Signed   By: Lacy Duverney M.D.   On: 02/16/2018 09:42   Ct Abdomen Pelvis W Contrast  Result Date: 02/12/2018 CLINICAL DATA:  Low right pelvic pain, possible diverticulitis EXAM: CT ABDOMEN AND PELVIS WITH CONTRAST TECHNIQUE: Multidetector CT imaging of the abdomen and pelvis was performed using the standard protocol following bolus administration of intravenous contrast. CONTRAST:  ISOVUE-300 IOPAMIDOL (ISOVUE-300) INJECTION 61% COMPARISON:  CT abdomen pelvis of 4 levin 2019 FINDINGS: Lower chest: The lung bases are clear. The heart appears to be within normal limits in size. Coronary artery calcifications are noted in the distribution of of the left anterior descending and right coronary arteries. Hepatobiliary: The liver enhances with no focal abnormality and no ductal  dilatation is seen. No calcified gallstones are noted. Pancreas: The pancreas is normal in size in the pancreatic duct is not dilated. The peripancreatic fat planes are well preserved. Spleen: The spleen is unremarkable. Adrenals/Urinary Tract: The adrenal glands appear normal. The kidneys enhance with no calculus or mass. No hydronephrosis is seen on delayed images. The ureters appear normal in caliber. The urinary bladder is moderately distended with no abnormality noted. Stomach/Bowel: The stomach is decompressed and cannot be well evaluated. No small bowel abnormality is seen. However, there is acute diverticulitis of the sigmoid colon anteriorly and to the left of midline. There is evidence of perforation and pericolonic abscess formation. The largest of the abscesses measures 2.7 x 4.3 cm on image 73 series 2. And adjacent abscess measures 2.2 x 2.9 cm. There is considerable inflammatory reaction with strandiness of the pericolonic fat planes and thickening of the mucosa of the sigmoid colon. The more proximal  colon is unremarkable. The terminal ileum appears normal. The appendix has previously been resected by history. Vascular/Lymphatic: The abdominal aorta is normal in caliber with moderate abdominal aortic atherosclerosis noted. No adenopathy is seen. Reproductive: The prostate is normal in size. Other: A tiny amount of fluid layers within the pelvis and within the right lower quadrant presumably due to the inflammatory response to the diverticulitis and diverticular abscesses. Musculoskeletal: The lumbar vertebrae are normal alignment with diffuse degenerative disc disease most marked at L5-S1. The SI joints are well corticated IMPRESSION: 1. Complicated acute diverticulitis of the sigmoid colon with mucosal edema, pericolonic strandiness, as well as evidence of perforation and pericolonic abscess formation as described above. 2. Coronary artery calcifications. 3. Moderate abdominal aortic  atherosclerosis. 4. Tiny amount of fluid layers within the pelvis and right lower quadrant presumably due to the acute diverticulitis. Electronically Signed   By: Dwyane Dee M.D.   On: 02/12/2018 17:06   Ct Abdomen Pelvis W Contrast  Result Date: 01/22/2018 CLINICAL DATA:  Mid central abdominal pain starting yesterday. Diverticulitis suspected EXAM: CT ABDOMEN AND PELVIS WITH CONTRAST TECHNIQUE: Multidetector CT imaging of the abdomen and pelvis was performed using the standard protocol following bolus administration of intravenous contrast. CONTRAST:  ISOVUE-300 IOPAMIDOL (ISOVUE-300) INJECTION 61% COMPARISON:  None. FINDINGS: Lower chest: Normal heart size without pericardial effusion. Clear lung bases. Hepatobiliary: No focal liver abnormality is seen. No gallstones, gallbladder wall thickening, or biliary dilatation. Pancreas: Unremarkable. No pancreatic ductal dilatation or surrounding inflammatory changes. Spleen: Normal in size without focal abnormality. Adrenals/Urinary Tract: Adrenal glands are unremarkable. Kidneys are normal, without renal calculi, focal lesion, or hydronephrosis. Bladder is unremarkable. Stomach/Bowel: Decompressed stomach with normal small bowel rotation. Mild sympathetic ileus of jejunal loops adjacent to pericolonic inflammation in the left lower quadrant and pelvis secondary to acute diverticulitis. Tiny contained extraluminal gas is seen within the mesenteric fat ventral to the proximal sigmoid. No abscess. 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 No lymphadenopathy. Reproductive: Top-normal size prostate. Unremarkable seminal vesicles. Other: No free fluid. Musculoskeletal: Multilevel degenerative disc disease from L2 through S1. No acute osseous abnormality. No pars defects or listhesis. IMPRESSION: Acute sigmoid diverticulitis with moderate degree of pericolonic inflammation and contained tiny foci of extraluminal gas from presumed  perforated diverticulum, series 2/71. No abscess or bowel obstruction. Sympathetic distention and ileus of adjacent jejunal loops from the pericolonic inflammation. These results were called by telephone at the time of interpretation on 01/22/2018 at 9:58 pm to Dr. Benjiman Core , who verbally acknowledged these results. Electronically Signed   By: Tollie Eth M.D.   On: 01/22/2018 21:58   Ct Image Guided Drainage By Percutaneous Catheter  Result Date: 02/16/2018 INDICATION: Diverticular abscess. Please perform CT-guided drainage catheter placement for infection source control purposes. EXAM: CT IMAGE GUIDED DRAINAGE BY PERCUTANEOUS CATHETER COMPARISON:  CT abdomen and pelvis - 02/16/2018; 02/12/2018; 01/22/2018 MEDICATIONS: The patient is currently admitted to the hospital and receiving intravenous antibiotics. The antibiotics were administered within an appropriate time frame prior to the initiation of the procedure. ANESTHESIA/SEDATION: Moderate (conscious) sedation was employed during this procedure. A total of Versed 1.5 mg and Fentanyl 75 mcg was administered intravenously. Moderate Sedation Time: 16 minutes. The patient's level of consciousness and vital signs were monitored continuously by radiology nursing throughout the procedure under my direct supervision. CONTRAST:  None COMPLICATIONS: None immediate. PROCEDURE: Informed written consent was obtained from the patient after a discussion of the risks, benefits and alternatives to treatment. The patient was  placed supine on the CT gantry and a pre procedural CT was performed re-demonstrating the known abscess/fluid collection within the left lower lobe abdomen/pelvis, anterior to the sigmoid colon with dominant collection measuring approximately 3.7 x 2.7 cm (image 13, series 3). The procedure was planned. A timeout was performed prior to the initiation of the procedure. The skin overlying the anterolateral aspect the lower abdomen/pelvis was prepped  and draped in the usual sterile fashion. The overlying soft tissues were anesthetized with 1% lidocaine with epinephrine. Appropriate trajectory was planned with the use of a 22 gauge spinal needle. An 18 gauge trocar needle was advanced into the abscess/fluid collection and a short Amplatz super stiff wire was coiled within the collection. Appropriate positioning was confirmed with a limited CT scan. The tract was serially dilated allowing placement of a 10 [place] all-purpose drainage catheter. Appropriate positioning was confirmed with a limited postprocedural CT scan. Approximately 15 ml of purulent fluid was aspirated. The tube was connected to a drainage bag and sutured in place. A dressing was placed. The patient tolerated the procedure well without immediate post procedural complication. IMPRESSION: Successful CT guided placement of a 10 French all purpose drain catheter into the diverticular abscess within the left lower abdomen/pelvis with aspiration of 15 mL of purulent fluid. Samples were sent to the laboratory as requested by the ordering clinical team. Electronically Signed   By: Simonne Come M.D.   On: 02/16/2018 17:10     Vitals:   02/17/18 0649 02/17/18 1340  BP: 129/83 129/80  Pulse: 84 85  Resp: 18   Temp: 98.4 F (36.9 C) 98.4 F (36.9 C)  SpO2: 98% 98%   Vitals:   02/16/18 2024 02/17/18 0500 02/17/18 0649 02/17/18 1340  BP:   129/83 129/80  Pulse:   84 85  Resp:   18   Temp:   98.4 F (36.9 C) 98.4 F (36.9 C)  TempSrc:   Oral Oral  SpO2: 99%  98% 98%  Weight:  85.1 kg (187 lb 9.6 oz)    Height:       The results of significant diagnostics from this hospitalization (including imaging, microbiology, ancillary and laboratory) are listed below for reference.     Microbiology: Recent Results (from the past 240 hour(s))  Aerobic/Anaerobic Culture (surgical/deep wound)     Status: None (Preliminary result)   Collection Time: 02/16/18  3:10 PM  Result Value Ref Range  Status   Specimen Description ABSCESS DRAIN  Final   Special Requests NONE  Final   Gram Stain   Final    ABUNDANT WBC PRESENT,BOTH PMN AND MONONUCLEAR FEW GRAM POSITIVE COCCI Performed at [facility], [address] [place], [place] [address]    Culture PENDING  Incomplete   Report Status PENDING  Incomplete     Labs: BNP (last 3 results) No results for input(s): BNP in the last 8760 hours. Basic Metabolic Panel: Recent Labs  Lab 02/12/18 1434 02/13/18 0452 02/14/18 0607 02/17/18 0531  NA 136 136 138 140  K 4.0 4.3 4.7 4.3  CL 98* 99* 98* 101  CO2 24 27 28 30   GLUCOSE 102* 107* 105* 93  BUN 10 9 9 14   CREATININE 0.91 0.94 0.98 1.03  CALCIUM 9.5 8.7* 9.1 9.2  MG  --   --   --  2.0   Liver Function Tests: Recent Labs  Lab 02/12/18 1812 02/13/18 0452 02/17/18 0531  AST 17 13* 15  ALT 15* 13* 16*  ALKPHOS 73 64 68  BILITOT 1.4* 1.6* 0.7  PROT 8.1 6.6 6.7  ALBUMIN 4.1 3.2* 3.2*   No results for input(s): LIPASE, AMYLASE in the last 168 hours. No results for input(s): AMMONIA in the last 168 hours. CBC: Recent Labs  Lab 02/12/18 1434 02/13/18 0452 02/14/18 0607 02/17/18 0531  WBC 13.6* 14.2* 9.6 8.5  NEUTROABS 11.3*  --   --  5.5  HGB 14.0 12.5* 13.0 13.1  HCT 43.1 39.5 42.1 41.6  MCV 93.1 94.5 95.2 93.9  PLT 358 345 347 375   Cardiac Enzymes: No results for input(s): CKTOTAL, CKMB, CKMBINDEX, TROPONINI in the last 168 hours. BNP: Invalid input(s): POCBNP CBG: No results for input(s): GLUCAP in the last 168 hours. D-Dimer No results for input(s): DDIMER in the last 72 hours. Hgb A1c No results for input(s): HGBA1C in the last 72 hours. Lipid Profile No results for input(s): CHOL, HDL, LDLCALC, TRIG, CHOLHDL, LDLDIRECT in the last 72 hours. Thyroid function studies No results for input(s): TSH, T4TOTAL, T3FREE, THYROIDAB in the last 72 hours.  Invalid input(s): FREET3 Anemia work up No results for input(s): VITAMINB12, FOLATE, FERRITIN,  TIBC, IRON, RETICCTPCT in the last 72 hours. Urinalysis    Component Value Date/Time   COLORURINE STRAW (A) 02/12/2018 1455   APPEARANCEUR CLEAR 02/12/2018 1455   LABSPEC 1.004 (L) 02/12/2018 1455   PHURINE 7.0 02/12/2018 1455   GLUCOSEU NEGATIVE 02/12/2018 1455   HGBUR SMALL (A) 02/12/2018 1455   BILIRUBINUR NEGATIVE 02/12/2018 1455   KETONESUR NEGATIVE 02/12/2018 1455   PROTEINUR NEGATIVE 02/12/2018 1455   NITRITE NEGATIVE 02/12/2018 1455   LEUKOCYTESUR NEGATIVE 02/12/2018 1455   Sepsis Labs Invalid input(s): PROCALCITONIN,  WBC,  LACTICIDVEN Microbiology Recent Results (from the past 240 hour(s))  Aerobic/Anaerobic Culture (surgical/deep wound)     Status: None (Preliminary result)   Collection Time: 02/16/18  3:10 PM  Result Value Ref Range Status   Specimen Description ABSCESS DRAIN  Final   Special Requests NONE  Final   Gram Stain   Final    ABUNDANT WBC PRESENT,BOTH PMN AND MONONUCLEAR FEW GRAM POSITIVE COCCI Performed at [facility], [address] [place], [place] [address]    Culture PENDING  Incomplete   Report Status PENDING  Incomplete   Time coordinating discharge: 34 mins  SIGNED:  Standley Dakins, MD  [facility] 02/17/2018, 2:28 PM Pager [phone]  If 7PM-7AM, please contact night-coverage [email or web address] Password [email or web address]

## 2018-02-17 NOTE — Discharge Instructions (Signed)
Drain care per the Interventional Radiology Instructions. 5mL of saline every 8 hours flushed into your drain. Antibiotics as prescribed.   Diverticulitis Diverticulitis is infection or inflammation of small pouches (diverticula) in the colon that form due to a condition called diverticulosis. Diverticula can trap stool (feces) and bacteria, causing infection and inflammation. Diverticulitis may cause severe stomach pain and diarrhea. It may lead to tissue damage in the colon that causes bleeding. The diverticula may also burst (rupture) and cause infected stool to enter other areas of the abdomen. Complications of diverticulitis can include:  Bleeding.  Severe infection.  Severe pain.  Rupture (perforation) of the colon.  Blockage (obstruction) of the colon.  What are the causes? This condition is caused by stool becoming trapped in the diverticula, which allows bacteria to grow in the diverticula. This leads to inflammation and infection. What increases the risk? You are more likely to develop this condition if:  You have diverticulosis. The risk for diverticulosis increases if: ? You are overweight or obese. ? You use tobacco products. ? You do not get enough exercise.  You eat a diet that does not include enough fiber. High-fiber foods include fruits, vegetables, beans, nuts, and whole grains.  What are the signs or symptoms? Symptoms of this condition may include:  Pain and tenderness in the abdomen. The pain is normally located on the left side of the abdomen, but it may occur in other areas.  Fever and chills.  Bloating.  Cramping.  Nausea.  Vomiting.  Changes in bowel routines.  Blood in your stool.  How is this diagnosed? This condition is diagnosed based on:  Your medical history.  A physical exam.  Tests to make sure there is nothing else causing your condition. These tests may include: ? Blood tests. ? Urine tests. ? Imaging tests of the abdomen,  including X-rays, ultrasounds, MRIs, or CT scans.  How is this treated? Most cases of this condition are mild and can be treated at home. Treatment may include:  Taking over-the-counter pain medicines.  Following a clear liquid diet.  Taking antibiotic medicines by mouth.  Rest.  More severe cases may need to be treated at a hospital. Treatment may include:  Not eating or drinking.  Taking prescription pain medicine.  Receiving antibiotic medicines through an IV tube.  Receiving fluids and nutrition through an IV tube.  Surgery.  When your condition is under control, your health care provider may recommend that you have a colonoscopy. This is an exam to look at the entire large intestine. During the exam, a lubricated, bendable tube is inserted into the anus and then passed into the rectum, colon, and other parts of the large intestine. A colonoscopy can show how severe your diverticula are and whether something else may be causing your symptoms. Follow these instructions at home: Medicines  Take over-the-counter and prescription medicines only as told by your health care provider. These include fiber supplements, probiotics, and stool softeners.  If you were prescribed an antibiotic medicine, take it as told by your health care provider. Do not stop taking the antibiotic even if you start to feel better.  Do not drive or use heavy machinery while taking prescription pain medicine. General instructions  Follow a full liquid diet or another diet as directed by your health care provider. After your symptoms improve, your health care provider may tell you to change your diet. He or she may recommend that you eat a diet that contains at least 25  g (25 grams) of fiber daily. Fiber makes it easier to pass stool. Healthy sources of fiber include: ? Berries. One cup contains 4-8 grams of fiber. ? Beans or lentils. One half cup contains 5-8 grams of fiber. ? Green vegetables. One cup  contains 4 grams of fiber.  Exercise for at least 30 minutes, 3 times each week. You should exercise hard enough to raise your heart rate and break a sweat.  Keep all follow-up visits as told by your health care provider. This is important. You may need a colonoscopy. Contact a health care provider if:  Your pain does not improve.  You have a hard time drinking or eating food.  Your bowel movements do not return to normal. Get help right away if:  Your pain gets worse.  Your symptoms do not get better with treatment.  Your symptoms suddenly get worse.  You have a fever.  You vomit more than one time.  You have stools that are bloody, black, or tarry. Summary  Diverticulitis is infection or inflammation of small pouches (diverticula) in the colon that form due to a condition called diverticulosis. Diverticula can trap stool (feces) and bacteria, causing infection and inflammation.  You are at higher risk for this condition if you have diverticulosis and you eat a diet that does not include enough fiber.  Most cases of this condition are mild and can be treated at home. More severe cases may need to be treated at a hospital.  When your condition is under control, your health care provider may recommend that you have an exam called a colonoscopy. This exam can show how severe your diverticula are and whether something else may be causing your symptoms. This information is not intended to replace advice given to you by your health care provider. Make sure you discuss any questions you have with your health care provider. Document Released: 07/10/2005 Document Revised: 11/02/2016 Document Reviewed: 11/02/2016 Elsevier Interactive Patient Education  2018 [employer].  Follow with Primary MD  Patient, No Pcp Per  and other consultant's as instructed your Hospitalist MD  Please get a complete blood count and chemistry panel checked by your Primary MD at your next visit, and again as  instructed by your Primary MD.  Get Medicines reviewed and adjusted: Please take all your medications with you for your next visit with your Primary MD  Laboratory/radiological data: Please request your Primary MD to go over all hospital tests and procedure/radiological results at the follow up, please ask your Primary MD to get all Hospital records sent to his/her office.  In some cases, they will be blood work, cultures and biopsy results pending at the time of your discharge. Please request that your primary care M.D. follows up on these results.  Also Note the following: If you experience worsening of your admission symptoms, develop shortness of breath, life threatening emergency, suicidal or homicidal thoughts you must seek medical attention immediately by calling 911 or calling your MD immediately  if symptoms less severe.  You must read complete instructions/literature along with all the possible adverse reactions/side effects for all the Medicines you take and that have been prescribed to you. Take any new Medicines after you have completely understood and accpet all the possible adverse reactions/side effects.   Do not drive when taking Pain medications or sleeping medications (Benzodaizepines)  Do not take more than prescribed Pain, Sleep and Anxiety Medications. It is not advisable to combine anxiety,sleep and pain medications without talking with  your primary care practitioner  Special Instructions: If you have smoked or chewed Tobacco  in the last 2 yrs please stop smoking, stop any regular Alcohol  and or any Recreational drug use.  Wear Seat belts while driving.  Please note: You were cared for by a hospitalist during your hospital stay. Once you are discharged, your primary care physician will handle any further medical issues. Please note that NO REFILLS for any discharge medications will be authorized once you are discharged, as it is imperative that you return to your  primary care physician (or establish a relationship with a primary care physician if you do not have one) for your post hospital discharge needs so that they can reassess your need for medications and monitor your lab values.

## 2018-02-17 NOTE — Progress Notes (Addendum)
Brandon Zavala [facility] Progress Note     Subjective: Doing fair. Eating. No complaints.   Objective: Vital signs in last 24 hours: Temp:  [98.4 F (36.9 C)] 98.4 F (36.9 C) (05/07 1340) Pulse Rate:  [84-109] 85 (05/07 1340) Resp:  [16-18] 18 (05/07 0649) BP: (129-181)/(80-103) 129/80 (05/07 1340) SpO2:  [96 %-99 %] 98 % (05/07 1340) Weight:  [187 lb 9.6 oz (85.1 kg)] 187 lb 9.6 oz (85.1 kg) (05/07 0500) Last BM Date: 02/16/18  Intake/Output from previous day: 05/06 0701 - 05/07 0700 In: 370 [P.O.:360] Out: 1020 [Urine:1000; Drains:20] Intake/Output this shift: Total I/O In: 360 [P.O.:360] Out: 350 [Urine:350]  General appearance: alert, cooperative and no distress GI: soft, nondistended, tender around drain, serous fluid in drain bag Extremities: extremities normal, atraumatic, no cyanosis or edema  Lab Results:  Recent Labs    02/17/18 0531  WBC 8.5  HGB 13.1  HCT 41.6  PLT 375   BMET Recent Labs    02/17/18 0531  NA 140  K 4.3  CL 101  CO2 30  GLUCOSE 93  BUN 14  CREATININE 1.03  CALCIUM 9.2    Studies/Results: Ct Abdomen Pelvis W Contrast  Result Date: 02/16/2018 CLINICAL DATA:  57 year old male with left lower abdominal pain with nausea and fever. Diverticulitis without abscess. Prior appendectomy. Subsequent encounter. EXAM: CT ABDOMEN AND PELVIS WITH CONTRAST TECHNIQUE: Multidetector CT imaging of the abdomen and pelvis was performed using the standard protocol following bolus administration of intravenous contrast. CONTRAST:  30mL ISOVUE-300 IOPAMIDOL (ISOVUE-300) INJECTION 61%, ISOVUE-300 IOPAMIDOL (ISOVUE-300) INJECTION 61% COMPARISON:  02/12/2018 and 01/22/2018 CT. FINDINGS: Lower chest: Lung bases are clear. Heart size within normal limits. Coronary artery calcifications. Hepatobiliary: No focal hepatic lesion. No calcified gallstone or common bile duct stone. Pancreas: No pancreatic mass or inflammation. Spleen: No splenic mass or  enlargement. Adrenals/Urinary Tract: No obstructing stone or hydronephrosis. No worrisome renal or adrenal lesion. Stomach/Bowel: Diffuse inflammation of the sigmoid colon. Given the presence of bowel wall thickening and diverticula, this is suspicious for diverticulitis with rupture and abscess formation. This segment of sigmoid colon can be evaluated after inflammatory processes cleared to exclude underlying mass. Abscess located along the anterior margin of the sigmoid colon has changed configuration slightly now measuring approximately 6.1 x 3.5 x 3.2 cm versus prior 7.1 x 3.6 x 3.6 cm. The inflammatory process extends to the dome of the bladder causing inflammation of the dome of the bladder which appears asymmetrically thickened. Vascular/Lymphatic: Aortic atherosclerotic changes without aneurysm or large vessel occlusion. No adenopathy. Reproductive: Prostate gland top-normal size. Other: Fat containing left inguinal hernia. Injection site anterior abdominal wall greater on right. Decrease amount of free fluid within the pelvis. Musculoskeletal: Degenerative changes lower thoracic and lumbar spine. Hip degenerative changes. IMPRESSION: Findings most consistent with sigmoid diverticulitis with perforation and abscess formation within the adjacent mesentery. Abscess has change configuration slightly now measuring approximately 6.1 x 3.5 x 3.2 cm versus prior 7.1 x 3.6 x 3.6 cm. The inflammatory process extends to the dome of the bladder causing inflammation of the dome of the bladder which appears asymmetrically thickened. This segment of sigmoid colon can be evaluated after acute episode has cleared to exclude underlying mass. Aortic Atherosclerosis (ICD10-I70.0). Coronary artery calcifications. Electronically Signed   By: Lacy Duverney M.D.   On: 02/16/2018 09:42   Ct Image Guided Drainage By Percutaneous Catheter  Result Date: 02/16/2018 INDICATION: Diverticular abscess. Please perform CT-guided drainage  catheter placement for infection source  control purposes. EXAM: CT IMAGE GUIDED DRAINAGE BY PERCUTANEOUS CATHETER COMPARISON:  CT abdomen and pelvis - 02/16/2018; 02/12/2018; 01/22/2018 MEDICATIONS: The patient is currently admitted to the hospital and receiving intravenous antibiotics. The antibiotics were administered within an appropriate time frame prior to the initiation of the procedure. ANESTHESIA/SEDATION: Moderate (conscious) sedation was employed during this procedure. A total of Versed 1.5 mg and Fentanyl 75 mcg was administered intravenously. Moderate Sedation Time: 16 minutes. The patient's level of consciousness and vital signs were monitored continuously by radiology nursing throughout the procedure under my direct supervision. CONTRAST:  None COMPLICATIONS: None immediate. PROCEDURE: Informed written consent was obtained from the patient after a discussion of the risks, benefits and alternatives to treatment. The patient was placed supine on the CT gantry and a pre procedural CT was performed re-demonstrating the known abscess/fluid collection within the left lower lobe abdomen/pelvis, anterior to the sigmoid colon with dominant collection measuring approximately 3.7 x 2.7 cm (image 13, series 3). The procedure was planned. A timeout was performed prior to the initiation of the procedure. The skin overlying the anterolateral aspect the lower abdomen/pelvis was prepped and draped in the usual sterile fashion. The overlying soft tissues were anesthetized with 1% lidocaine with epinephrine. Appropriate trajectory was planned with the use of a 22 gauge spinal needle. An 18 gauge trocar needle was advanced into the abscess/fluid collection and a short Amplatz super stiff wire was coiled within the collection. Appropriate positioning was confirmed with a limited CT scan. The tract was serially dilated allowing placement of a 10 [place] all-purpose drainage catheter. Appropriate positioning was confirmed  with a limited postprocedural CT scan. Approximately 15 ml of purulent fluid was aspirated. The tube was connected to a drainage bag and sutured in place. A dressing was placed. The patient tolerated the procedure well without immediate post procedural complication. IMPRESSION: Successful CT guided placement of a 10 French all purpose drain catheter into the diverticular abscess within the left lower abdomen/pelvis with aspiration of 15 mL of purulent fluid. Samples were sent to the laboratory as requested by the ordering clinical team. Electronically Signed   By: Simonne Come M.D.   On: 02/16/2018 17:10    Anti-infectives: Anti-infectives (From admission, onward)   Start     Dose/Rate Route Frequency Ordered Stop   02/12/18 2000  Ampicillin-Sulbactam (UNASYN) 3 g in sodium chloride 0.9 % 100 mL IVPB     3 g 200 mL/hr over 30 Minutes Intravenous Every 8 hours 02/12/18 1934     02/12/18 1745  ciprofloxacin (CIPRO) IVPB 400 mg     400 mg 200 mL/hr over 60 Minutes Intravenous  Once 02/12/18 1735 02/12/18 1842   02/12/18 1745  metroNIDAZOLE (FLAGYL) IVPB 500 mg  Status:  Discontinued     500 mg 100 mL/hr over 60 Minutes Intravenous  Once 02/12/18 1735 02/12/18 1815      Assessment/Plan: Doing well. Drain in place for perforated diverticulitis. Will need to get drain clinic follow up and also needs colonoscopy. Has previously been referred.  -Roxi Rx sent in -Dr. Laural Benes doing Home Health orders for now  -Drain care per IR, 5mL flush q 8hours  -Antibiotics for 10 days -Follow up with me 5/16   LOS: 5 days    Lucretia Roers 02/17/2018

## 2018-02-18 ENCOUNTER — Other Ambulatory Visit: Payer: Self-pay | Admitting: General Surgery

## 2018-02-18 DIAGNOSIS — K5720 Diverticulitis of large intestine with perforation and abscess without bleeding: Secondary | ICD-10-CM

## 2018-02-18 NOTE — Care Management Note (Signed)
Case Management Note  Patient Details  Name: Brandon Zavala MRN: [id number] Date of Birth: 30-May-1961  Subjective/Objective:              Admitted with diverticular abscess. Pt from home, lives alone, ind. Pt has PCP and transportation. Pt will DC with drain in place.       Action/Plan: Needs HH nursing for drain care. Pt has no preference of provider. Referral sent to [facility] in [place]. Referral was accepted and start of care is scheduled for 02/19/18 at 1030.   Expected Discharge Date:  02/17/18               Expected Discharge Plan:  Home w Home Health Services  In-House Referral:  NA  Discharge planning Services  CM Consult  Post Acute Care Choice:  Home Health Choice offered to:  Patient  HH Arranged:  RN [facility] Agency:  [facility]  Status of Service:  Completed, signed off  Malcolm Metro, RN 02/18/2018, 3:10 PM

## 2018-02-21 LAB — AEROBIC/ANAEROBIC CULTURE W GRAM STAIN (SURGICAL/DEEP WOUND)

## 2018-02-21 LAB — AEROBIC/ANAEROBIC CULTURE (SURGICAL/DEEP WOUND)

## 2018-02-25 ENCOUNTER — Ambulatory Visit
Admission: RE | Admit: 2018-02-25 | Discharge: 2018-02-25 | Disposition: A | Discharge status: Home / Self Care | Payer: Medicaid - Out of State | Source: Ambulatory Visit | Attending: General Surgery | Admitting: General Surgery

## 2018-02-25 ENCOUNTER — Ambulatory Visit
Admission: RE | Admit: 2018-02-25 | Discharge: 2018-02-25 | Disposition: A | Discharge status: Home / Self Care | Payer: Medicaid - Out of State | Source: Ambulatory Visit | Attending: Radiology | Admitting: Radiology

## 2018-02-25 ENCOUNTER — Encounter: Payer: Self-pay | Admitting: Radiology

## 2018-02-25 DIAGNOSIS — K5720 Diverticulitis of large intestine with perforation and abscess without bleeding: Secondary | ICD-10-CM

## 2018-02-25 HISTORY — PX: IR RADIOLOGIST EVAL & MGMT: IMG5224

## 2018-02-25 MED ORDER — IOHEXOL 300 MG/ML  SOLN
100.0000 mL | Freq: Once | INTRAMUSCULAR | Status: AC | PRN
  Administered 2018-02-25: 100 mL via INTRAVENOUS

## 2018-02-25 NOTE — Progress Notes (Signed)
Referring Physician(s): Bridges,L  Chief Complaint: The patient is seen in follow up today s/p CT-guided drainage of left lower quadrant diverticular abscess on 02/16/18  History of present illness: Mr. Borawski is a 57 year old male with history of diverticulitis and associated left lower quadrant abscess who underwent CT-guided drainage on 02/16/2018.  He presents today for follow-up CT and drain assessment.  According to the patient he has done well since discharge.  He reports minimal discomfort at drain site.  He denies fever, chills, respiratory problems, nausea, vomiting or bleeding.  He is flushing his drain every 8 hours, however output has been minimal.  He continues to take Augmentin.  Previous cultures from fluid grew Staphylococcus aureus.  He is scheduled to follow-up with his surgeon Dr. Henreitta Leber tomorrow.   Past Medical History:  Diagnosis Date  . Diverticulitis   . Elevated bilirubin     Past Surgical History:  Procedure Laterality Date  . APPENDECTOMY      Allergies: Patient has no known allergies.  Medications: Prior to Admission medications   Medication Sig Start Date End Date Taking? Authorizing Provider  ALPRAZolam Prudy Feeler) 1 MG tablet Take 1 mg by mouth at bedtime as needed for anxiety.    [provider]  amoxicillin-clavulanate (AUGMENTIN) 875-125 MG tablet Take 1 tablet by mouth 2 (two) times daily for 10 days. 02/18/18 02/28/18  Johnson, Clanford L, MD  busPIRone (BUSPAR) 10 MG tablet Take 10 mg by mouth daily.    [provider]  docusate sodium (COLACE) 100 MG capsule Take 2 capsules (200 mg total) by mouth 2 (two) times daily for 10 days. 02/17/18 02/27/18  Johnson, Clanford L, MD  ibuprofen (ADVIL,MOTRIN) 200 MG tablet Take 400 mg by mouth every 6 (six) hours as needed.    [provider]  oxyCODONE (OXY IR/ROXICODONE) 5 MG immediate release tablet Take 1 tablet (5 mg total) by mouth every 4 (four) hours as needed for severe pain or  breakthrough pain. 02/17/18   Lucretia Roers, MD     Family History  Problem Relation Age of Onset  . Hypertension Mother   . COPD Sister   . Hypertension Sister     Social History   Socioeconomic History  . Marital status: Single    Spouse name: Not on file  . Number of children: Not on file  . Years of education: Not on file  . Highest education level: Not on file  Occupational History  . Not on file  Social Needs  . Financial resource strain: Not on file  . Food insecurity:    Worry: Not on file    Inability: Not on file  . Transportation needs:    Medical: Not on file    Non-medical: Not on file  Tobacco Use  . Smoking status: Former Games developer  . Smokeless tobacco: Never Used  Substance and Sexual Activity  . Alcohol use: Yes    Frequency: Never  . Drug use: Never  . Sexual activity: Yes  Lifestyle  . Physical activity:    Days per week: Not on file    Minutes per session: Not on file  . Stress: Not on file  Relationships  . Social connections:    Talks on phone: Not on file    Gets together: Not on file    Attends religious service: Not on file    Active member of club or organization: Not on file    Attends meetings of clubs or organizations: Not on file  Relationship status: Not on file  Other Topics Concern  . Not on file  Social History Narrative  . Not on file     Vital Signs: Blood pressure 166/93, heart rate 85, temperature 98.4, oxygen saturation 97% room air   Physical Exam awake, alert.  Chest clear to auscultation bilaterally.  Heart with regular rate and rhythm.  Abdomen soft, positive bowel sounds, intact left lower quadrant abdominal drain with minimal erythema at insertion site.  Site not significantly tender to palpation.  No significant fluid in drain bag  Imaging: No results found.  Labs:  CBC: Recent Labs    02/12/18 1434 02/13/18 0452 02/14/18 0607 02/17/18 0531  WBC 13.6* 14.2* 9.6 8.5  HGB 14.0 12.5* 13.0 13.1  HCT  43.1 39.5 42.1 41.6  PLT 358 345 347 375    COAGS: Recent Labs    02/12/18 1756  INR 1.03    BMP: Recent Labs    02/12/18 1434 02/13/18 0452 02/14/18 0607 02/17/18 0531  NA 136 136 138 140  K 4.0 4.3 4.7 4.3  CL 98* 99* 98* 101  CO2 GLUCOSE 102* 107* 105* 93  BUN CALCIUM 9.5 8.7* 9.1 9.2  CREATININE 0.91 0.94 0.98 1.03  GFRNONAA >60 >60 >60 >60  GFRAA >60 >60 >60 >60    LIVER FUNCTION TESTS: Recent Labs    01/24/18 0622 02/12/18 1812 02/13/18 0452 02/17/18 0531  BILITOT 1.2 1.4* 1.6* 0.7  AST 13* 17 13* 15  ALT 14* 15* 13* 16*  ALKPHOS 56 73 64 68  PROT 6.4* 8.1 6.6 6.7  ALBUMIN 3.2* 4.1 3.2* 3.2*    Assessment: 57 year old male with history of diverticulitis and associated left lower quadrant abscess who underwent CT-guided drainage on 02/16/2018.  Patient currently stable/afebrile and with minimal output from drainage catheter despite 3 times daily irrigation.  He remains on Augmentin.  Drain fluid cultures previously grew staph aureus.  Follow-up CT abdomen pelvis today shows resolution of previously drained abscess.  Decision made to remove drain.  LLQ abdominal drain removed in its entirety without immediate complications.  Gauze dressing applied over site.  Patient scheduled to follow-up with surgeon Dr. Henreitta Leber tomorrow.  Site care discussed with patient.   Signed: D. Jeananne Rama, PA-C 02/25/2018, 1:19 PM   Please refer to Dr. Kenna Gilbert attestation of this note for management and plan.      Patient ID: Brandon Zavala, male   DOB: 08/31/1961, 57 y.o.   MRN: [id number]

## 2018-02-26 ENCOUNTER — Ambulatory Visit (INDEPENDENT_AMBULATORY_CARE_PROVIDER_SITE_OTHER): Payer: Self-pay | Admitting: General Surgery

## 2018-02-26 ENCOUNTER — Encounter: Payer: Self-pay | Admitting: General Surgery

## 2018-02-26 VITALS — BP 153/96 | HR 85 | Temp 98.9°F | Resp 18 | Wt 193.0 lb

## 2018-02-26 DIAGNOSIS — K5720 Diverticulitis of large intestine with perforation and abscess without bleeding: Secondary | ICD-10-CM

## 2018-02-26 NOTE — Progress Notes (Signed)
[facility] Note   HPI:  57 y.o. Male presents to clinic for follow-up evaluation of perforated diverticulitis.  He had an IR drain that was removed yesterday. And he is doing well. No pain no complaints.  Review of Systems:  No fevers or chills Pain improved  All other review of systems: otherwise negative   Vital Signs:  BP (!) 153/96 (BP Location: Left Arm, Patient Position: Sitting, Cuff Size: Normal)   Pulse 85   Temp 98.9 F (37.2 C) (Temporal)   Resp 18   Wt 193 lb (87.5 kg)   BMI 26.18 kg/m    Physical Exam:  Physical Exam  Constitutional: He is oriented to person, place, and time. He appears well-developed and well-nourished.  Cardiovascular: Normal rate.  Pulmonary/Chest: Effort normal.  Abdominal: Soft. He exhibits no distension. There is no tenderness.  Drain site with dresing  Neurological: He is alert and oriented to person, place, and time.  Skin: Skin is warm and dry.  Vitals reviewed.   Laboratory studies: None Imaging:  Ct a/p with resolved abscess and no inflammation in colon     Assessment:  57 y.o. yo Male with perforated diverticulitis.   Plan:  - Referral to colonoscopy already sent, will follow up   - Elective lap hand assisted sigmoid colectomy in the upcoming weeks after colonoscopy - Patient to find PCP  - Follow up after colonoscopy   All of the above recommendations were discussed with the patient, and all of patient's questions were answered to his expressed satisfaction.  Algis Greenhouse, MD [facility] [address] [place], [place] [address] [phone] (office)

## 2018-02-26 NOTE — Patient Instructions (Signed)
Colonoscopy then elective sigmoid colectomy.

## 2018-06-03 IMAGING — CT CT ABD-PELV W/ CM
2 of 4 series · 13 of 46 positions shown, 15 images · IV contrast (omnipaque)
Comparison: 01/22/2018, 02/12/2018, 02/16/2018

CLINICAL DATA: 57-year-old male with a history of diverticular
abscess, status post drainage

EXAM:
CT ABDOMEN AND PELVIS WITH CONTRAST
TECHNIQUE: Multidetector CT imaging of the abdomen and pelvis was performed
using the standard protocol following bolus administration of
intravenous contrast.
CONTRAST:  100mL OMNIPAQUE IOHEXOL 300 MG/ML  SOLN

[Series 2: abd pelvis 5.00 br40 s3 ax · axial · 0.66mm/px · z∈[+1252,+1702]mm · 10 of 109 slices shown, 12 images]
[im 10/109  soft-tissue]
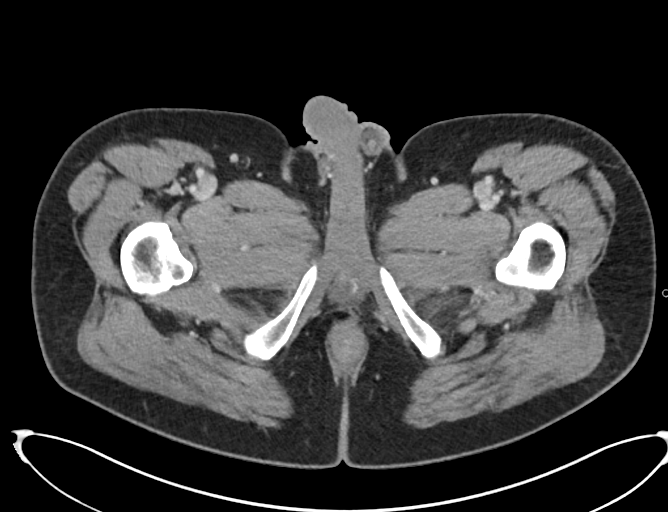
[im 10/109  bone]
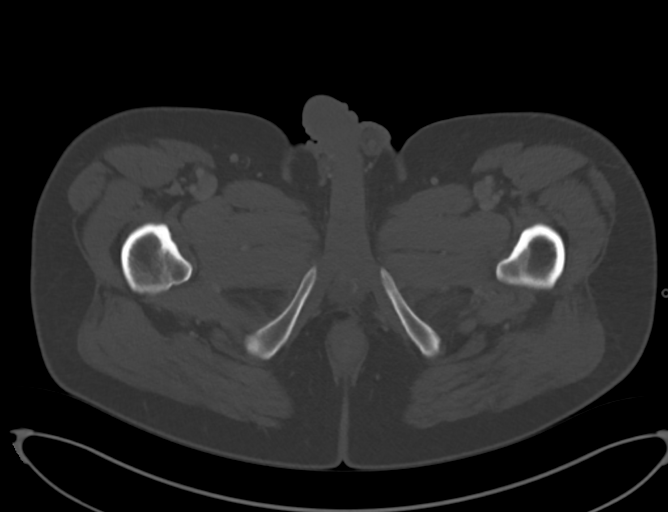
[im 19/109  soft-tissue]
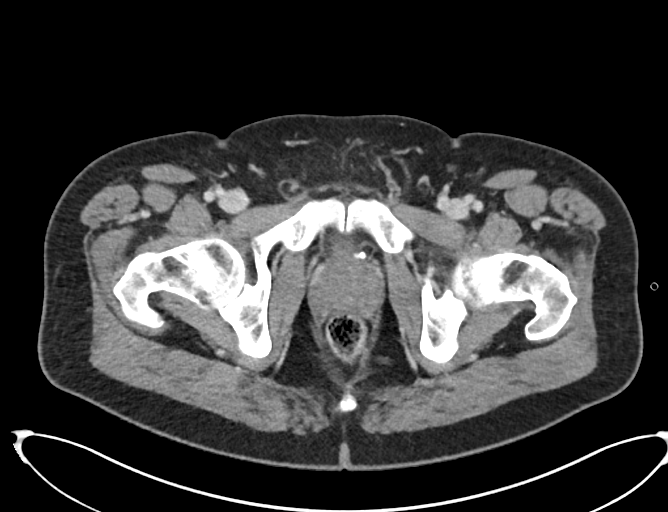
[im 28/109  soft-tissue]
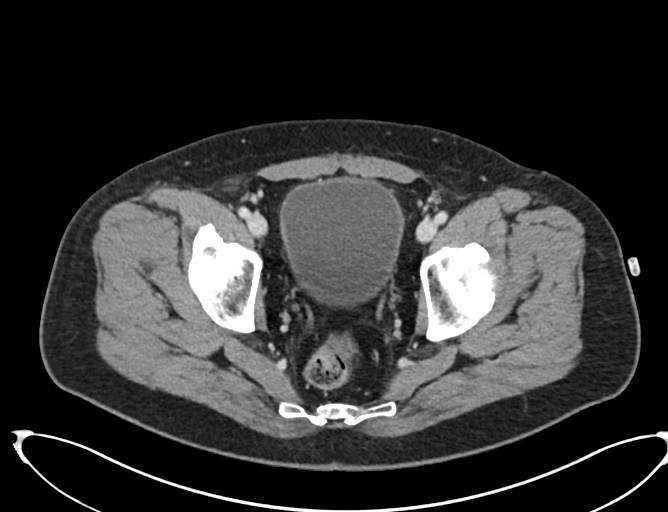
[im 41/109  soft-tissue]
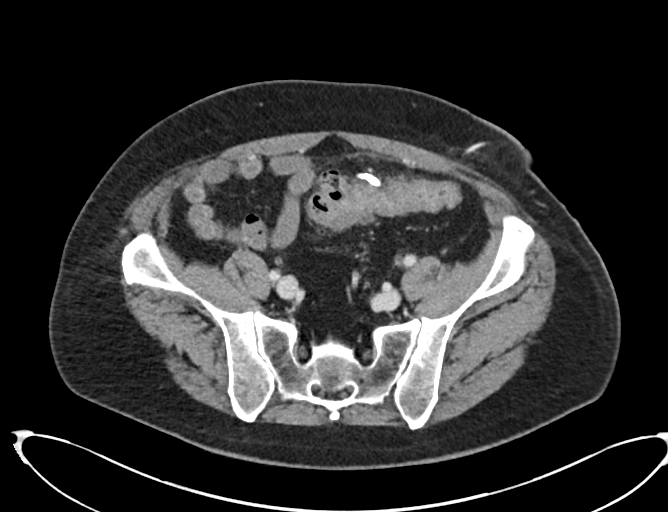
[im 50/109  soft-tissue]
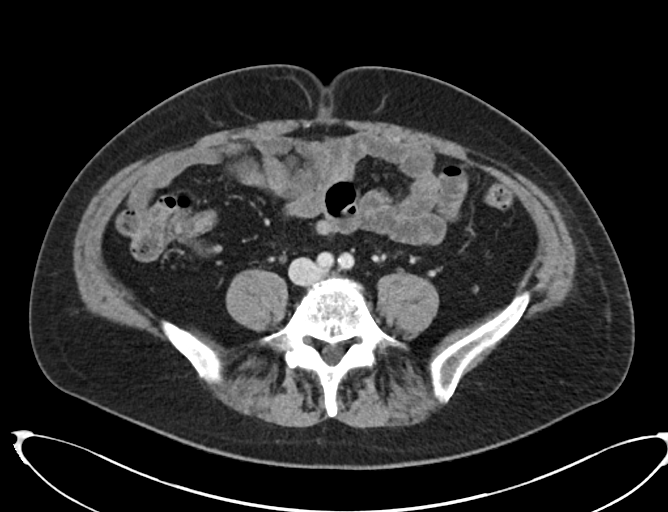
[im 59/109  soft-tissue]
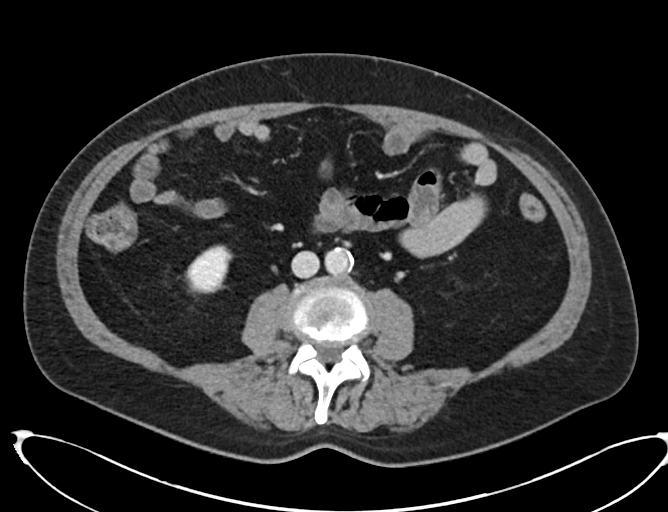
[im 68/109  soft-tissue]
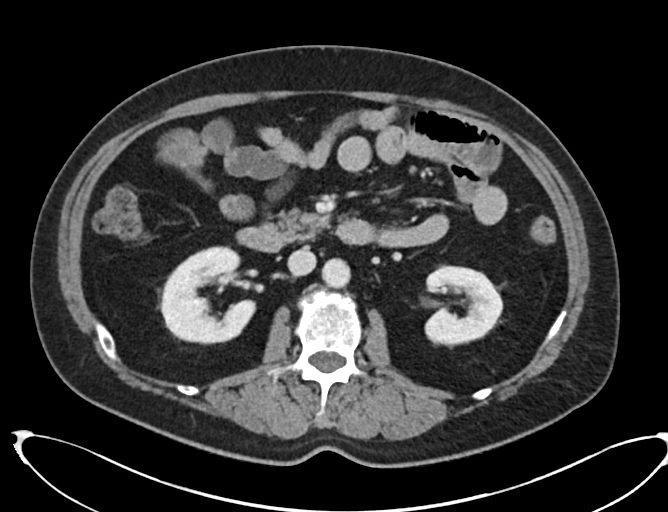
[im 82/109  soft-tissue]
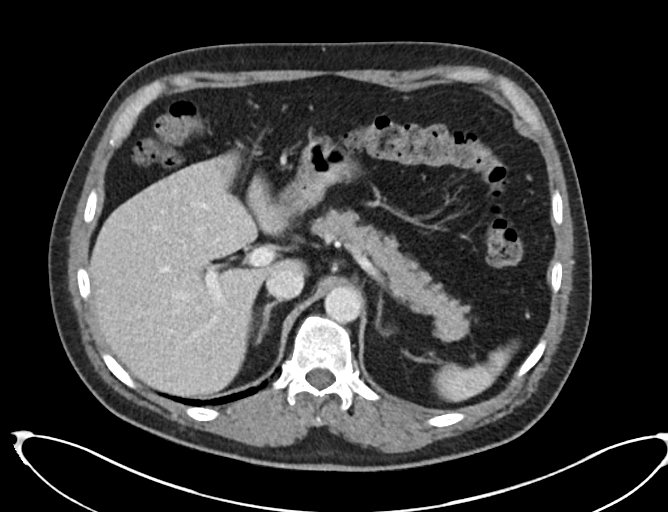
[im 91/109  soft-tissue]
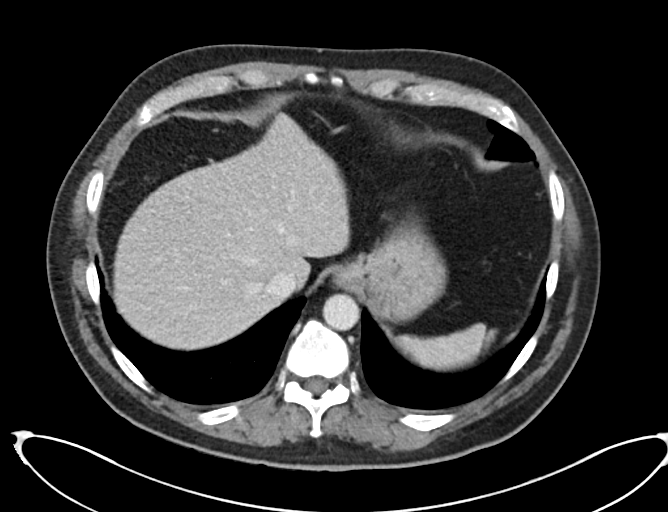
[im 91/109  bone]
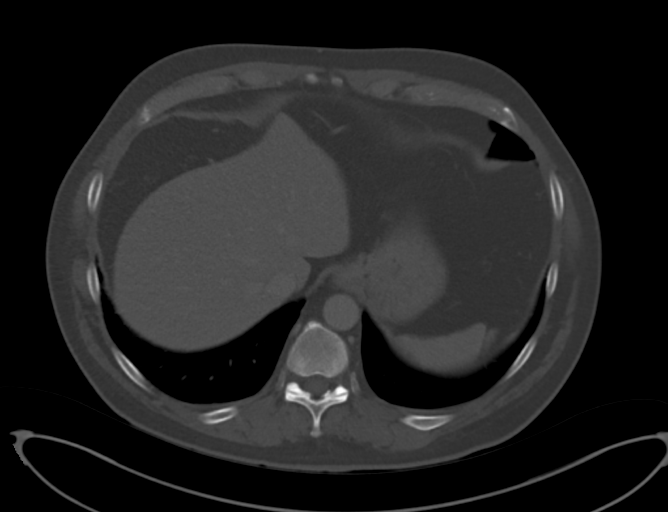
[im 100/109  soft-tissue]
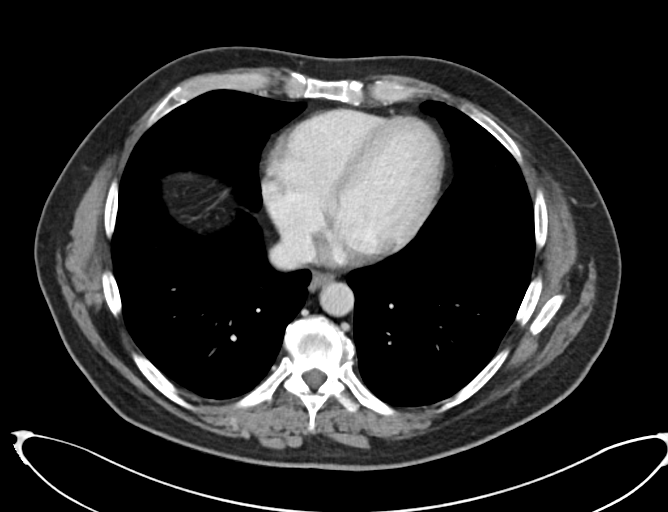

[Series 6: abd pelvis 2.00 br40 s3 cor · coronal · 0.86mm/px · 3 of 169 slices shown]
[im 57/169  soft-tissue]
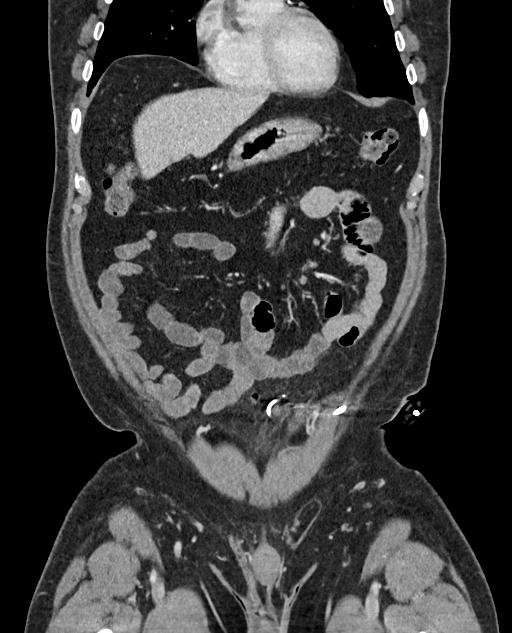
[im 75/169  soft-tissue]
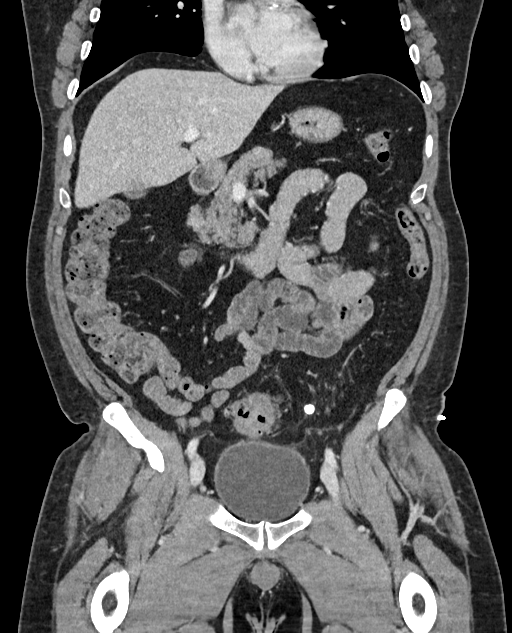
[im 94/169  soft-tissue]
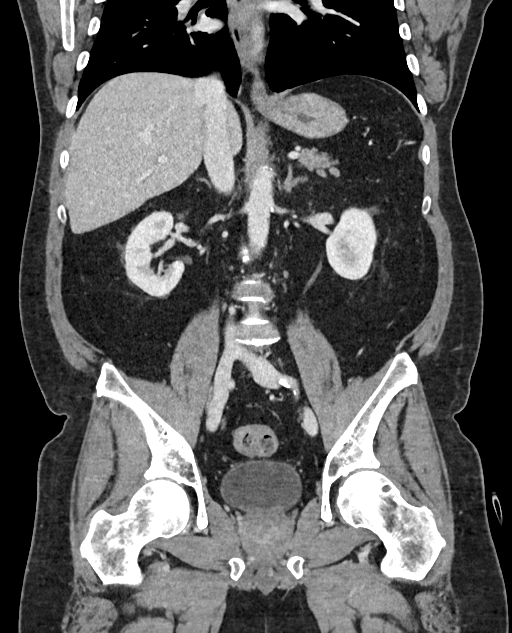

[13 of 46 positions shown; findings below may reference images not displayed]

FINDINGS: Lower chest: No acute abnormality.

Hepatobiliary: Unremarkable appearance of liver. Unremarkable
gallbladder

Pancreas: Unremarkable pancreas

Spleen: Unremarkable spleen

Adrenals/Urinary Tract: Unremarkable appearance of the adrenal
glands. No evidence of hydronephrosis of the right or left kidney.
No nephrolithiasis. Unremarkable course of the bilateral ureters.
Unremarkable appearance of the urinary bladder.

Stomach/Bowel: Unremarkable stomach. Unremarkable small bowel. No
abnormal distention. No focal wall thickening. No transition point.

Pigtail drainage catheter is unchanged in position in the left lower
quadrant. There is no residual fluid adjacent to the pigtail
drainage catheter, with trace inflammatory changes of the fat. No
extraluminal air. Diverticular change of the colon with no
significant stool burden.

Vascular/Lymphatic: Mild atherosclerotic changes of the abdominal
aorta. Bilateral iliac arteries and proximal femoral arteries
patent. No lymphadenopathy

Reproductive: Transverse diameter of the prostate measures 4.8 cm

Other: Small fat containing umbilical hernia

Musculoskeletal: No acute displaced fracture. Degenerative changes
of the thoracolumbar spine. No bony canal narrowing. Degenerative
changes of the hips.
IMPRESSION: Unchanged position of pigtail drainage catheter within the left
lower quadrant with interval resolution of the abscess, and no
significant residual inflammatory changes.

Colonic diverticular disease with no significant stool burden.
